# Patient Record
Sex: Male | Born: 1965 | Race: Black or African American | Hispanic: No | Marital: Married | Smoking: Never smoker
Health system: Southern US, Community
[De-identification: ages and names within clinical notes are randomized; demographics above are authoritative.]

## PROBLEM LIST (undated history)

## (undated) DIAGNOSIS — E785 Hyperlipidemia, unspecified: Secondary | ICD-10-CM

## (undated) DIAGNOSIS — I10 Essential (primary) hypertension: Secondary | ICD-10-CM

## (undated) HISTORY — DX: Hyperlipidemia, unspecified: E78.5

---

## 1999-05-29 ENCOUNTER — Encounter: Admission: RE | Admit: 1999-05-29 | Discharge: 1999-05-29 | Payer: Self-pay | Admitting: Orthopedic Surgery

## 1999-05-29 ENCOUNTER — Encounter: Payer: Self-pay | Admitting: Orthopedic Surgery

## 2010-07-10 ENCOUNTER — Emergency Department (HOSPITAL_BASED_OUTPATIENT_CLINIC_OR_DEPARTMENT_OTHER)
Admission: EM | Admit: 2010-07-10 | Discharge: 2010-07-10 | Payer: Self-pay | Source: Home / Self Care | Admitting: Emergency Medicine

## 2014-08-03 LAB — TSH: TSH: 3.24 u[IU]/mL (ref 0.41–5.90)

## 2014-08-03 LAB — CBC AND DIFFERENTIAL: Hemoglobin: 14.8 g/dL (ref 13.5–17.5)

## 2014-08-09 LAB — PSA: PSA: 1.59

## 2014-11-01 LAB — LIPID PANEL
Cholesterol: 189 mg/dL (ref 0–200)
HDL: 68 mg/dL (ref 35–70)
LDL CALC: 107 mg/dL
Triglycerides: 71 mg/dL (ref 40–160)

## 2014-11-02 LAB — BASIC METABOLIC PANEL
BUN: 17 mg/dL (ref 4–21)
CHLORIDE: 101 mmol/L
CO2: 25 mmol/L
Calcium: 9 mg/dL
Creatinine: 0.9 mg/dL (ref 0.6–1.3)
GLUCOSE: 98 mg/dL
Potassium: 3.9 mmol/L (ref 3.4–5.3)
SODIUM: 143 mmol/L (ref 137–147)

## 2016-01-20 ENCOUNTER — Encounter (HOSPITAL_BASED_OUTPATIENT_CLINIC_OR_DEPARTMENT_OTHER): Payer: Self-pay | Admitting: Emergency Medicine

## 2016-01-20 ENCOUNTER — Emergency Department (HOSPITAL_BASED_OUTPATIENT_CLINIC_OR_DEPARTMENT_OTHER): Payer: Self-pay

## 2016-01-20 DIAGNOSIS — Z79899 Other long term (current) drug therapy: Secondary | ICD-10-CM | POA: Insufficient documentation

## 2016-01-20 DIAGNOSIS — M25562 Pain in left knee: Secondary | ICD-10-CM | POA: Insufficient documentation

## 2016-01-20 DIAGNOSIS — I10 Essential (primary) hypertension: Secondary | ICD-10-CM | POA: Insufficient documentation

## 2016-01-20 NOTE — ED Notes (Signed)
Patient reports intermittent left knee pain x 2 -3 weeks. Denies injury

## 2016-01-21 ENCOUNTER — Emergency Department (HOSPITAL_BASED_OUTPATIENT_CLINIC_OR_DEPARTMENT_OTHER)
Admission: EM | Admit: 2016-01-21 | Discharge: 2016-01-21 | Disposition: A | Discharge status: Home / Self Care | Payer: Self-pay | Attending: Emergency Medicine | Admitting: Emergency Medicine

## 2016-01-21 ENCOUNTER — Encounter (HOSPITAL_BASED_OUTPATIENT_CLINIC_OR_DEPARTMENT_OTHER): Payer: Self-pay | Admitting: Emergency Medicine

## 2016-01-21 DIAGNOSIS — M25562 Pain in left knee: Secondary | ICD-10-CM

## 2016-01-21 HISTORY — DX: Essential (primary) hypertension: I10

## 2016-01-21 MED ORDER — PIROXICAM 10 MG PO CAPS: 10.0000 mg | ORAL_CAPSULE | Freq: Every day | ORAL | Status: DC

## 2016-01-21 MED ORDER — KETOROLAC TROMETHAMINE 60 MG/2ML IM SOLN
60.0000 mg | Freq: Once | INTRAMUSCULAR | Status: AC
  Administered 2016-01-21: 60 mg via INTRAMUSCULAR
  Filled 2016-01-21: qty 2

## 2016-01-21 NOTE — ED Provider Notes (Signed)
CSN: [id number]     Arrival date & time 01/20/16  2219 History   First MD Initiated Contact with Patient 01/21/16 0131     Chief Complaint  Patient presents with  . Knee Pain     (Consider location/radiation/quality/duration/timing/severity/associated sxs/prior Treatment) Patient is a 50 y.o. male presenting with knee pain. The history is provided by the patient. No language interpreter was used.  Knee Pain Location:  Knee Time since incident:  4 weeks Knee location:  L knee Pain details:    Quality:  Aching   Radiates to:  Does not radiate   Severity:  Moderate   Onset quality:  Sudden   Timing:  Constant   Progression:  Unchanged Chronicity:  New Dislocation: no   Foreign body present:  No foreign bodies Prior injury to area:  No Relieved by:  Nothing Worsened by:  Nothing tried Ineffective treatments:  None tried Associated symptoms: no itching, no stiffness and no swelling   Risk factors: no concern for non-accidental trauma     Past Medical History  Diagnosis Date  . Hypertension    History reviewed. No pertinent past surgical history. History reviewed. No pertinent family history. Social History  Substance Use Topics  . Smoking status: Never Smoker   . Smokeless tobacco: None  . Alcohol Use: Yes     Comment: occ    Review of Systems  Musculoskeletal: Negative for stiffness.  Skin: Negative for itching.  All other systems reviewed and are negative.     Allergies  Review of patient's allergies indicates no known allergies.  Home Medications   Prior to Admission medications   Medication Sig Start Date End Date Taking? Authorizing Provider  hydrochlorothiazide (MICROZIDE) 12.5 MG capsule Take 12.5 mg by mouth daily.   Yes Historical Provider, MD  lisinopril (PRINIVIL,ZESTRIL) 10 MG tablet Take 10 mg by mouth daily.   Yes Historical Provider, MD   BP 162/103 mmHg  Pulse 66  Temp(Src) 98 F (36.7 C) (Oral)  Resp 18  Ht 5\' 6"  (1.676 m)  Wt 180 lb  (81.647 kg)  BMI 29.07 kg/m2  SpO2 100% Physical Exam  Constitutional: He is oriented to person, place, and time. He appears well-developed and well-nourished. No distress.  HENT:  Head: Normocephalic and atraumatic.  Mouth/Throat: Oropharynx is clear and moist.  Eyes: Conjunctivae are normal. Pupils are equal, round, and reactive to light.  Neck: Normal range of motion. Neck supple.  Cardiovascular: Normal rate, regular rhythm and intact distal pulses.   Pulmonary/Chest: Effort normal and breath sounds normal. No respiratory distress. He has no wheezes. He has no rales.  Abdominal: Soft. Bowel sounds are normal. There is no tenderness. There is no rebound and no guarding.  Musculoskeletal: Normal range of motion.  Neurological: He is alert and oriented to person, place, and time.  Skin: Skin is warm and dry.  Psychiatric: He has a normal mood and affect.    ED Course  Procedures (including critical care time) Labs Review Labs Reviewed - No data to display  Imaging Review Dg Knee Complete 4 Views Left  01/20/2016  CLINICAL DATA:  50 year old male with intermittent left knee pain. EXAM: LEFT KNEE - COMPLETE 4+ VIEW COMPARISON:  None. FINDINGS: There is no acute fracture or dislocation. The bones are well mineralized. There is a small suprapatellar effusion. The soft tissues are otherwise unremarkable. No radiopaque foreign object. IMPRESSION: No acute fracture or dislocation Electronically Signed   By: Elgie CollardArash  Radparvar M.D.   On: 01/20/2016  22:55   I have personally reviewed and evaluated these images and lab results as part of my medical decision-making.   EKG Interpretation None      MDM   Final diagnoses:  None    Filed Vitals:   01/21/16 0129 01/21/16 0254  BP: 162/103 154/102  Pulse: 66 64  Temp: 98 F (36.7 C) 97.6 F (36.4 C)  Resp: 18 18   No results found for this or any previous visit. Dg Knee Complete 4 Views Left  01/20/2016  CLINICAL DATA:  50 year old  male with intermittent left knee pain. EXAM: LEFT KNEE - COMPLETE 4+ VIEW COMPARISON:  None. FINDINGS: There is no acute fracture or dislocation. The bones are well mineralized. There is a small suprapatellar effusion. The soft tissues are otherwise unremarkable. No radiopaque foreign object. IMPRESSION: No acute fracture or dislocation Electronically Signed   By: Elgie CollardArash  Radparvar M.D.   On: 01/20/2016 22:55    Medications  ketorolac (TORADOL) injection 60 mg (60 mg Intramuscular Given 01/21/16 0250)    Will send home knee sleeve and NSAIDs.  PRN follow up with orthopedics    Janeice Stegall, MD 01/21/16 [id number]

## 2016-01-21 NOTE — Discharge Instructions (Signed)

## 2016-01-21 NOTE — ED Notes (Signed)
Pt states he has had left knee swelling for approx 2-3 weeks. Redness noted. No hx of gout or injury.

## 2016-01-21 NOTE — ED Notes (Signed)
CMS intact before and after. Pt tolerated well. No questions.

## 2016-01-21 NOTE — ED Notes (Signed)
Pt given d/c instructions as per chart. Rx x 1. Verbalizes understanding. No questions. 

## 2016-03-01 ENCOUNTER — Encounter (HOSPITAL_BASED_OUTPATIENT_CLINIC_OR_DEPARTMENT_OTHER): Payer: Self-pay | Admitting: *Deleted

## 2016-03-01 ENCOUNTER — Emergency Department (HOSPITAL_BASED_OUTPATIENT_CLINIC_OR_DEPARTMENT_OTHER)
Admission: EM | Admit: 2016-03-01 | Discharge: 2016-03-01 | Disposition: A | Discharge status: Home / Self Care | Payer: Self-pay | Attending: Emergency Medicine | Admitting: Emergency Medicine

## 2016-03-01 DIAGNOSIS — Z79899 Other long term (current) drug therapy: Secondary | ICD-10-CM | POA: Insufficient documentation

## 2016-03-01 DIAGNOSIS — M25562 Pain in left knee: Secondary | ICD-10-CM | POA: Insufficient documentation

## 2016-03-01 DIAGNOSIS — I10 Essential (primary) hypertension: Secondary | ICD-10-CM | POA: Insufficient documentation

## 2016-03-01 LAB — SYNOVIAL CELL COUNT + DIFF, W/ CRYSTALS
Crystals, Fluid: NONE SEEN
EOSINOPHILS-SYNOVIAL: 1 % (ref 0–1)
Lymphocytes-Synovial Fld: 31 % — ABNORMAL HIGH (ref 0–20)
Monocyte-Macrophage-Synovial Fluid: 24 % — ABNORMAL LOW (ref 50–90)
NEUTROPHIL, SYNOVIAL: 44 % — AB (ref 0–25)
WBC, SYNOVIAL: 89 /mm3 (ref 0–200)

## 2016-03-01 LAB — PATHOLOGIST SMEAR REVIEW

## 2016-03-01 MED ORDER — TRIAMCINOLONE ACETONIDE 40 MG/ML IJ SUSP
40.0000 mg | Freq: Once | INTRAMUSCULAR | Status: AC
  Administered 2016-03-01: 40 mg via INTRA_ARTICULAR
  Filled 2016-03-01: qty 5

## 2016-03-01 MED ORDER — BUPIVACAINE HCL (PF) 0.5 % IJ SOLN
10.0000 mL | Freq: Once | INTRAMUSCULAR | Status: AC
  Administered 2016-03-01: 10 mL
  Filled 2016-03-01: qty 10

## 2016-03-01 MED ORDER — PREDNISONE 10 MG PO TABS: 20.0000 mg | ORAL_TABLET | Freq: Two times a day (BID) | ORAL | 0 refills | Status: DC

## 2016-03-01 MED ORDER — HYDROCODONE-ACETAMINOPHEN 5-325 MG PO TABS: 1.0000 | ORAL_TABLET | Freq: Four times a day (QID) | ORAL | 0 refills | Status: DC | PRN

## 2016-03-01 NOTE — ED Provider Notes (Addendum)
[facility]   CSN: [id number] Arrival date & time: 03/01/16  4081  First Provider Contact:  First MD Initiated Contact with Patient 03/01/16 [phone]        History   Chief Complaint Chief Complaint  Patient presents with  . Knee Pain    HPI Billy Baker is a 50 y.o. male.  Patient is a 50 year old male with no significant past medical history. He presents for evaluation of knee pain. This is been ongoing for several months. He was seen here several weeks ago, however is not improved with anti-inflammatories and compressive dressings. His knee remained swollen and painful. He denies any fevers or chills. He reports pain in his knee on the bedpost several months ago, but otherwise denies any injury or trauma.   The history is provided by the patient.  Knee Pain   This is a new problem. Episode onset: 3 months ago. The pain is present in the right knee. The pain is moderate. He has tried nothing for the symptoms. The treatment provided no relief.    Past Medical History:  Diagnosis Date  . Hypertension     There are no active problems to display for this patient.   History reviewed. No pertinent surgical history.     Home Medications    Prior to Admission medications   Medication Sig Start Date End Date Taking? Authorizing Provider  hydrochlorothiazide (MICROZIDE) 12.5 MG capsule Take 12.5 mg by mouth daily.    Historical Provider, MD  lisinopril (PRINIVIL,ZESTRIL) 10 MG tablet Take 10 mg by mouth daily.    Historical Provider, MD  piroxicam (FELDENE) 10 MG capsule Take 1 capsule (10 mg total) by mouth daily. 01/21/16   Cy Blamer, MD    Family History History reviewed. No pertinent family history.  Social History Social History  Substance Use Topics  . Smoking status: Never Smoker  . Smokeless tobacco: Never Used  . Alcohol use Yes     Comment: occ     Allergies   Review of patient's allergies indicates no known  allergies.   Review of Systems Review of Systems  All other systems reviewed and are negative.    Physical Exam Updated Vital Signs BP 126/90 (BP Location: Right Arm)   Pulse 75   Temp 98.1 F (36.7 C) (Oral)   Resp 18   Ht 5\' 6"  (1.676 m)   Wt 180 lb (81.6 kg)   SpO2 97%   BMI 29.05 kg/m   Physical Exam  Constitutional: He is oriented to person, place, and time. He appears well-developed and well-nourished.  HENT:  Head: Normocephalic and atraumatic.  Neck: Normal range of motion. Neck supple.  Musculoskeletal:  The left knee has what appears to be a small to medium sized effusion. He has pain with range of motion. There is no crepitus. Anterior and posterior drawer tests are negative. There is no laxity with varus or valgus stress.  Neurological: He is alert and oriented to person, place, and time.  Skin: Skin is warm and dry.  Nursing note and vitals reviewed.    ED Treatments / Results  Labs (all labs ordered are listed, but only abnormal results are displayed) Labs Reviewed - No data to display  EKG  EKG Interpretation None       Radiology No results found.  Procedures Procedures (including critical care time)  Medications Ordered in ED Medications  bupivacaine (MARCAINE) 0.5 % injection 10 mL (10 mLs Infiltration Given 03/01/16 0642)  triamcinolone acetonide (KENALOG-40) injection 40 mg (40 mg Intra-articular Given 03/01/16 2130)     Initial Impression / Assessment and Plan / ED Course  I have reviewed the triage vital signs and the nursing notes.  Pertinent labs & imaging results that were available during my care of the patient were reviewed by me and considered in my medical decision making (see chart for details).  Clinical Course    ARTHOCENTESIS Performed by: Geoffery Lyons Consent: Verbal consent obtained. Risks and benefits: risks, benefits and alternatives were discussed Consent given by: patient Required items: required blood  products, implants, devices, and special equipment available Patient identity confirmed: verbally with patient Time out: Immediately prior to procedure a "time out" was called to verify the correct patient, procedure, equipment, support staff and site/side marked as required. Indications: Left knee pain and swelling  Joint: Left knee Local anesthesia used: Marcaine  Preparation: Patient was prepped and draped in the usual sterile fashion. Aspirate appearance: Clear yellow  Aspirate amount: 3 ml Patient tolerance: Patient tolerated the procedure well with no immediate complications.     Final Clinical Impressions(s) / ED Diagnoses   Final diagnoses:  None   My initial plan was to treat with steroids and follow up with Orthopedics, however the patient states that he has no insurance and "something has to be done". Arthrocentesis was performed yielding only several cc of clear yellow fluid. He was given 40 mg of Kenalog along with 3 mL of Marcaine intra-articularly.  He will be discharged with pain medication, prednisone, and follow-up with an orthopedic doctor.  New Prescriptions New Prescriptions   No medications on file     Geoffery Lyons, MD 03/01/16 8657    Geoffery Lyons, MD 03/01/16 [phone]

## 2016-03-01 NOTE — Discharge Instructions (Signed)
Prednisone as prescribed.  Hydrocodone as prescribed as needed for pain.  Follow-up with an orthopedist if not improving in the next week. The contact information for Dr. Veda Canning has been provided for you call and make these arrangements.

## 2016-03-01 NOTE — ED Notes (Signed)
Dr. Judd Lien in to see pt, at [facility].

## 2016-03-01 NOTE — ED Triage Notes (Addendum)
Returns for similar L knee pain, pain returning, worsening, not getting better with feldene or ACE wrap. Pinpoints to medial knee. Aggravated by work/ walking on concrete floors. (denies: recent injury, knee surgeries, gout, fever, hot to touch, numbness/ tingling or other sx), has not f/u with specialist d/t no insurance. No PCP. States, "has new job and has to wait for insurance to begin". Ambulatory with steady gait, with limp. Alert, NAD, calm, interactive. Wife at [facility].

## 2016-04-16 ENCOUNTER — Encounter: Payer: Self-pay | Admitting: Family Medicine

## 2016-04-16 ENCOUNTER — Ambulatory Visit (INDEPENDENT_AMBULATORY_CARE_PROVIDER_SITE_OTHER): Payer: Managed Care, Other (non HMO) | Admitting: Family Medicine

## 2016-04-16 DIAGNOSIS — E785 Hyperlipidemia, unspecified: Secondary | ICD-10-CM | POA: Diagnosis not present

## 2016-04-16 DIAGNOSIS — I10 Essential (primary) hypertension: Secondary | ICD-10-CM

## 2016-04-16 DIAGNOSIS — M25562 Pain in left knee: Secondary | ICD-10-CM | POA: Diagnosis not present

## 2016-04-16 LAB — COMPREHENSIVE METABOLIC PANEL
ALT: 13 U/L (ref 9–46)
AST: 14 U/L (ref 10–35)
Albumin: 4.2 g/dL (ref 3.6–5.1)
Alkaline Phosphatase: 55 U/L (ref 40–115)
BUN: 12 mg/dL (ref 7–25)
CHLORIDE: 102 mmol/L (ref 98–110)
CO2: 27 mmol/L (ref 20–31)
CREATININE: 1.21 mg/dL (ref 0.70–1.33)
Calcium: 9.3 mg/dL (ref 8.6–10.3)
GLUCOSE: 112 mg/dL — AB (ref 65–99)
Potassium: 3.7 mmol/L (ref 3.5–5.3)
SODIUM: 140 mmol/L (ref 135–146)
TOTAL PROTEIN: 6.6 g/dL (ref 6.1–8.1)
Total Bilirubin: 0.7 mg/dL (ref 0.2–1.2)

## 2016-04-16 LAB — CBC
HCT: 41 % (ref 38.5–50.0)
Hemoglobin: 14.3 g/dL (ref 13.2–17.1)
MCH: 28.5 pg (ref 27.0–33.0)
MCHC: 34.9 g/dL (ref 32.0–36.0)
MCV: 81.8 fL (ref 80.0–100.0)
MPV: 10.4 fL (ref 7.5–12.5)
PLATELETS: 186 10*3/uL (ref 140–400)
RBC: 5.01 MIL/uL (ref 4.20–5.80)
RDW: 13.8 % (ref 11.0–15.0)
WBC: 9.7 10*3/uL (ref 3.8–10.8)

## 2016-04-16 LAB — TSH: TSH: 1.09 mIU/L (ref 0.40–4.50)

## 2016-04-16 MED ORDER — LISINOPRIL-HYDROCHLOROTHIAZIDE 20-12.5 MG PO TABS: 1.0000 | ORAL_TABLET | Freq: Every day | ORAL | 0 refills | Status: DC

## 2016-04-16 MED ORDER — ATORVASTATIN CALCIUM 20 MG PO TABS: 20.0000 mg | ORAL_TABLET | Freq: Every day | ORAL | 0 refills | Status: DC

## 2016-04-16 MED ORDER — DICLOFENAC SODIUM 1 % TD GEL: 4.0000 g | Freq: Four times a day (QID) | TRANSDERMAL | 11 refills | Status: DC

## 2016-04-16 NOTE — Patient Instructions (Signed)
Thank you for coming in today. 1) Left Knee: Take tylenol daily for pain.  Use voltaren gel 4x daily.  Return in a few weeks if not better.  2) Blood pressure: Stop individual medicines and start the combo pill.  3) Cholesterol: Restart Atorvastatin. Recheck in 3 months.   Get labs today.    Meniscus Tear A meniscus tear is a knee injury in which a piece of the meniscus is torn. The meniscus is a thick, rubbery, wedge-shaped cartilage in the knee. Two menisci are located in each knee. They sit between the upper bone (femur) and lower bone (tibia) that make up the knee joint. Each meniscus acts as a shock absorber for the knee. A torn meniscus is one of the most common types of knee injuries. This injury can range from mild to severe. Surgery may be needed for a severe tear. CAUSES This injury may be caused by any squatting, twisting, or pivoting movement. Sports-related injuries are the most common cause. These often occur from:  Running and stopping suddenly.  Changing direction.  Being tackled or knocked off your feet. As people get older, their meniscus gets thinner and weaker. In these people, tears can happen more easily, such as from climbing stairs.  RISK FACTORS This injury is more likely to happen to:  People who play contact sports.  Males.  People who are 2130-51 years of age. SYMPTOMS  Symptoms of this injury include:  Knee pain, especially at the side of the knee joint. You may feel pain when the injury occurs, or you may only hear a pop and feel pain later.  A feeling that your knee is clicking, catching, locking, or giving way.  Not being able to fully bend or extend your knee.  Bruising or swelling in your knee. DIAGNOSIS  This injury may be diagnosed based on your symptoms and a physical exam. The physical exam may include:  Moving your knee in different ways.  Feeling for tenderness.  Listening for a clicking sound.  Checking if your knee locks or  catches. You may also have tests, such as:  X-rays.  MRI.  A procedure to look inside your knee with a narrow surgical telescope (arthroscopy). You may be referred to a knee specialist (orthopedic surgeon). TREATMENT  Treatment for this injury depends on the severity of the tear. Treatment for a mild tear may include:  Rest.  Medicine to reduce pain and swelling. This is usually a nonsteroidal anti-inflammatory drug (NSAID).  A knee brace or an elastic sleeve or wrap.  Using crutches or a walker to keep weight off your knee and to help you walk.  Exercises to strengthen your knee (physical therapy). You may need surgery if you have a severe tear or if other treatments are not working.  HOME CARE INSTRUCTIONS Managing Pain and Swelling  Take over-the-counter and prescription medicines only as told by your health care provider.  If directed, apply ice to the injured area:  Put ice in a plastic bag.  Place a towel between your skin and the bag.  Leave the ice on for 20 minutes, 2-3 times per day.  Raise (elevate) the injured area above the level of your heart while you are sitting or lying down. Activity  Do not use the injured limb to support your body weight until your health care provider says that you can. Use crutches or a walker as told by your health care provider.  Return to your normal activities as told by  your health care provider. Ask your health care provider what activities are safe for you.  Perform range-of-motion exercises only as told by your health care provider.  Begin doing exercises to strengthen your knee and leg muscles only as told by your health care provider. After you recover, your health care provider may recommend these exercises to help prevent another injury. General Instructions  Use a knee brace or elastic wrap as told by your health care provider.  Keep all follow-up visits as told by your health care provider. This is important. SEEK  MEDICAL CARE IF:  You have a fever.  Your knee becomes red, tender, or swollen.  Your pain medicine is not helping.  Your symptoms get worse or do not improve after 2 weeks of home care.   This information is not intended to replace advice given to you by your health care provider. Make sure you discuss any questions you have with your health care provider.   Document Released: 10/05/2002 Document Revised: 04/05/2015 Document Reviewed: 11/07/2014 Elsevier Interactive Patient Education [employer]

## 2016-04-16 NOTE — Progress Notes (Signed)
Billy Baker is a 50 y.o. male who presents to [name]: Primary Care Sports Medicine today for establish care and discuss left knee pain hypertension and hyperlipidemia.  Left knee pain: Patient notes 6 months of left knee pain. He denies any injury. He notes the pain is felt bilaterally and occasionally associated with locking and catching and popping. He was seen in the emergency department about 6 weeks ago where x-rays were unremarkable. He had aspiration and injection including steroids. The aspirate was negative for crystals. He notes the injection did not help much at all. He notes the injection was performed separately from the aspiration using a blind technique. He notes significant pain especially following a long day standing on his feet.  Hypertension: Patient has long-standing hypertension. He takes hydrochlorothiazide 12.5 mg daily as well as lisinopril 10 mg daily. No chest pains palpitations shortness of breath.  Hyperlipidemia: Patient is not currently taking atorvastatin. This is been prescribed in the past. He denies any muscle aches or pain while on this medication.    Past Medical History:  Diagnosis Date  . Hyperlipidemia   . Hypertension    History reviewed. No pertinent surgical history. Social History  Substance Use Topics  . Smoking status: Never Smoker  . Smokeless tobacco: Never Used  . Alcohol use Yes     Comment: occ   family history includes Cancer in his father; Diabetes in his mother; Heart disease in his maternal grandfather; Hypertension in his father.  ROS as above: No headache, visual changes, nausea, vomiting, diarrhea, constipation, dizziness, abdominal pain, skin rash, fevers, chills, night sweats, weight loss, swollen lymph nodes, body aches, joint swelling, muscle aches, chest pain, shortness of breath, mood changes, visual or auditory hallucinations.      Medications: Current Outpatient Prescriptions  Medication Sig Dispense Refill  . atorvastatin (LIPITOR) 20 MG tablet Take 1 tablet (20 mg total) by mouth daily. 90 tablet 0  . diclofenac sodium (VOLTAREN) 1 % GEL Apply 4 g topically 4 (four) times daily. To affected joint. 100 g 11  . lisinopril-hydrochlorothiazide (ZESTORETIC) 20-12.5 MG tablet Take 1 tablet by mouth daily. 90 tablet 0   No current facility-administered medications for this visit.    No Known Allergies   Exam:  BP (!) 141/93   Pulse 97   Ht 5\' 6"  (1.676 m)   Wt 188 lb (85.3 kg)   BMI 30.34 kg/m  Gen: Well NAD HEENT: EOMI,  MMM Lungs: Normal work of breathing. CTABL Heart: RRR no MRG Abd: NABS, Soft. Nondistended, Nontender Exts: Brisk capillary refill, warm and well perfused.  Left knee: Mild effusion no skin changes. Range of motion 0-100 with mild 1+ retropatellar crepitations. Nontender. Stable ligamentous exam. Positive medial McMurray's test.  Show images for DG Knee Complete 4 Views Left  Study Result   CLINICAL DATA:  50 year old male with intermittent left knee pain.  EXAM: LEFT KNEE - COMPLETE 4+ VIEW  COMPARISON:  None.  FINDINGS: There is no acute fracture or dislocation. The bones are well mineralized. There is a small suprapatellar effusion. The soft tissues are otherwise unremarkable. No radiopaque foreign object.  IMPRESSION: No acute fracture or dislocation   Electronically Signed   By: Elgie Collard M.D.   On: 01/20/2016 22:55     Procedure: Real-time Ultrasound Guided Injection of left knee   Device: GE Logiq E  Images permanently stored and available for review in the ultrasound unit. Verbal informed consent obtained.  Discussed risks and benefits of procedure. Warned about infection bleeding damage to structures skin hypopigmentation and fat atrophy among others. Patient expresses understanding and agreement Time-out conducted.  Noted no overlying  erythema, induration, or other signs of local infection.  Skin prepped in a sterile fashion.  Local anesthesia: Topical Ethyl chloride.  With sterile technique and under real time ultrasound guidance: 80mg  Kenalog and 4 mL of Marcaine injected easily.  Completed without difficulty  Pain immediately resolved suggesting accurate placement of the medication.  Advised to call if fevers/chills, erythema, induration, drainage, or persistent bleeding.  Images permanently stored and available for review in the ultrasound unit.  Impression: Technically successful ultrasound guided injection.    No results found for this or any previous visit (from the past 24 hour(s)). No results found.    Assessment and Plan: 50 y.o. male with   Left knee pain: Concerning for meniscus injury. Injection performed today. MRI pending. Return following MRI.  Hypertension: Increase to lisinopril 20 hydrochlorothiazide 12.5. Check CMP TSH vitamin D.  Hyperlipidemia: Restart Lipitor. Check labs in 3 months   Orders Placed This Encounter  Procedures  . MR Knee Left  Wo Contrast    Standing Status:   Future    Standing Expiration Date:   06/16/2017    Order Specific Question:   Reason for Exam (SYMPTOM  OR DIAGNOSIS REQUIRED)    Answer:   eval pain suspect meniscus injury    Order Specific Question:   [facility]    [facility]    Order Specific Question:   What is the patient's sedation requirement?    Answer:   No Sedation    Order Specific Question:   Does the patient have a pacemaker or implanted devices?    Answer:   No  . CBC  . Comprehensive metabolic panel    Order Specific Question:   Has the patient fasted?    Answer:   No  . Hemoglobin A1c  . TSH  . VITAMIN D 25 Hydroxy (Vit-D Deficiency, Fractures)    Discussed warning signs or symptoms. Please see discharge instructions. Patient expresses understanding.

## 2016-04-17 ENCOUNTER — Encounter: Payer: Self-pay | Admitting: Family Medicine

## 2016-04-17 DIAGNOSIS — R7303 Prediabetes: Secondary | ICD-10-CM | POA: Insufficient documentation

## 2016-04-17 DIAGNOSIS — E559 Vitamin D deficiency, unspecified: Secondary | ICD-10-CM | POA: Insufficient documentation

## 2016-04-17 LAB — HEMOGLOBIN A1C
HEMOGLOBIN A1C: 5.8 % — AB (ref <5.7)
Mean Plasma Glucose: 120 mg/dL

## 2016-04-17 LAB — VITAMIN D 25 HYDROXY (VIT D DEFICIENCY, FRACTURES): Vit D, 25-Hydroxy: 21 ng/mL — ABNORMAL LOW (ref 30–100)

## 2016-04-24 ENCOUNTER — Encounter: Payer: Self-pay | Admitting: Family Medicine

## 2016-04-26 ENCOUNTER — Telehealth: Payer: Self-pay | Admitting: Family Medicine

## 2016-04-26 DIAGNOSIS — S83209A Unspecified tear of unspecified meniscus, current injury, unspecified knee, initial encounter: Secondary | ICD-10-CM | POA: Insufficient documentation

## 2016-04-26 DIAGNOSIS — S83207A Unspecified tear of unspecified meniscus, current injury, left knee, initial encounter: Secondary | ICD-10-CM

## 2016-04-26 NOTE — Telephone Encounter (Signed)
MRI knee shows meniscus tear.  Return to clinic with image CD to go over results in detail.

## 2016-04-29 ENCOUNTER — Encounter: Payer: Self-pay | Admitting: Family Medicine

## 2016-04-29 NOTE — Telephone Encounter (Signed)
Pt notified. appt scheduled.

## 2016-05-02 ENCOUNTER — Ambulatory Visit: Payer: Managed Care, Other (non HMO) | Admitting: Family Medicine

## 2016-05-14 ENCOUNTER — Ambulatory Visit (INDEPENDENT_AMBULATORY_CARE_PROVIDER_SITE_OTHER): Payer: Managed Care, Other (non HMO) | Admitting: Family Medicine

## 2016-05-14 ENCOUNTER — Encounter: Payer: Self-pay | Admitting: Family Medicine

## 2016-05-14 VITALS — BP 133/80 | HR 70 | Wt 183.0 lb

## 2016-05-14 DIAGNOSIS — S83242A Other tear of medial meniscus, current injury, left knee, initial encounter: Secondary | ICD-10-CM

## 2016-05-14 NOTE — Progress Notes (Signed)
                                                                      Billy Baker is a 50 y.o. male who [facility] today for follow-up knee pain. Patient was seen over the past several weeks. He's been complaining of pain now for a few months and was given a steroid injection and help for a few weeks. Additionally an MRI that showed a posterior horn medial meniscus tear. Today for follow-up. He notes popping clicking and slight joint instability with activity. Symptoms are bothersome and interfering with quality of life.   Past Medical History:  Diagnosis Date  . Hyperlipidemia   . Hypertension    No past surgical history on file. Social History  Substance Use Topics  . Smoking status: Never Smoker  . Smokeless tobacco: Never Used  . Alcohol use Yes     Comment: occ   family history includes Cancer in his father; Diabetes in his mother; Heart disease in his maternal grandfather; Hypertension in his father.  ROS as above:  Medications: Current Outpatient Prescriptions  Medication Sig Dispense Refill  . atorvastatin (LIPITOR) 20 MG tablet Take 1 tablet (20 mg total) by mouth daily. 90 tablet 0  . diclofenac sodium (VOLTAREN) 1 % GEL Apply 4 g topically 4 (four) times daily. To affected joint. 100 g 11  . lisinopril-hydrochlorothiazide (ZESTORETIC) 20-12.5 MG tablet Take 1 tablet by mouth daily. 90 tablet 0   No current facility-administered medications for this visit.    No Known Allergies  Health Maintenance Health Maintenance  Topic Date Due  . HIV Screening  12/22/1979  . INFLUENZA VACCINE  04/16/2017 (Originally 02/27/2016)  . COLONOSCOPY  07/30/2023  . TETANUS/TDAP  08/09/2024     Exam:  BP 133/80   Pulse 70   Wt 183 lb (83 kg)   BMI 29.54 kg/m  Gen: Well NAD Left knee: Mild effusion no skin changes. Range of motion 0-100 with mild 1+ retropatellar crepitations. Nontender. Stable ligamentous  exam. Positive medial McMurray's test.  Scanned MRI documented showing posterior horn medial meniscus tear reviewed   No results found for this or any previous visit (from the past 72 hour(s)). No results found.    Assessment and Plan: 50 y.o. male with medial meniscus tear with mechanical symptoms failed conservative management. Refer to orthopedic surgery for evaluation and possible surgical treatment.   Orders Placed This Encounter  Procedures  . Ambulatory referral to Orthopedic Surgery    Referral Priority:   Routine    Referral Type:   Surgical    Referral Reason:   Specialty Services Required    Requested Specialty:   Orthopedic Surgery    Number of Visits Requested:   1    Discussed warning signs or symptoms. Please see discharge instructions. Patient expresses understanding.

## 2016-05-14 NOTE — Patient Instructions (Signed)
Thank you for coming in today. Follow up with Dr Magnus IvanBlackman.     Meniscus Tear A meniscus tear is a knee injury in which a piece of the meniscus is torn. The meniscus is a thick, rubbery, wedge-shaped cartilage in the knee. Two menisci are located in each knee. They sit between the upper bone (femur) and lower bone (tibia) that make up the knee joint. Each meniscus acts as a shock absorber for the knee. A torn meniscus is one of the most common types of knee injuries. This injury can range from mild to severe. Surgery may be needed for a severe tear. CAUSES This injury may be caused by any squatting, twisting, or pivoting movement. Sports-related injuries are the most common cause. These often occur from:  Running and stopping suddenly.  Changing direction.  Being tackled or knocked off your feet. As people get older, their meniscus gets thinner and weaker. In these people, tears can happen more easily, such as from climbing stairs.  RISK FACTORS This injury is more likely to happen to:  People who play contact sports.  Males.  People who are 51-640 years of age. SYMPTOMS  Symptoms of this injury include:  Knee pain, especially at the side of the knee joint. You may feel pain when the injury occurs, or you may only hear a pop and feel pain later.  A feeling that your knee is clicking, catching, locking, or giving way.  Not being able to fully bend or extend your knee.  Bruising or swelling in your knee. DIAGNOSIS  This injury may be diagnosed based on your symptoms and a physical exam. The physical exam may include:  Moving your knee in different ways.  Feeling for tenderness.  Listening for a clicking sound.  Checking if your knee locks or catches. You may also have tests, such as:  X-rays.  MRI.  A procedure to look inside your knee with a narrow surgical telescope (arthroscopy). You may be referred to a knee specialist (orthopedic surgeon). TREATMENT  Treatment  for this injury depends on the severity of the tear. Treatment for a mild tear may include:  Rest.  Medicine to reduce pain and swelling. This is usually a nonsteroidal anti-inflammatory drug (NSAID).  A knee brace or an elastic sleeve or wrap.  Using crutches or a walker to keep weight off your knee and to help you walk.  Exercises to strengthen your knee (physical therapy). You may need surgery if you have a severe tear or if other treatments are not working.  HOME CARE INSTRUCTIONS Managing Pain and Swelling  Take over-the-counter and prescription medicines only as told by your health care provider.  If directed, apply ice to the injured area:  Put ice in a plastic bag.  Place a towel between your skin and the bag.  Leave the ice on for 20 minutes, 2-3 times per day.  Raise (elevate) the injured area above the level of your heart while you are sitting or lying down. Activity  Do not use the injured limb to support your body weight until your health care provider says that you can. Use crutches or a walker as told by your health care provider.  Return to your normal activities as told by your health care provider. Ask your health care provider what activities are safe for you.  Perform range-of-motion exercises only as told by your health care provider.  Begin doing exercises to strengthen your knee and leg muscles only as told by your  health care provider. After you recover, your health care provider may recommend these exercises to help prevent another injury. General Instructions  Use a knee brace or elastic wrap as told by your health care provider.  Keep all follow-up visits as told by your health care provider. This is important. SEEK MEDICAL CARE IF:  You have a fever.  Your knee becomes red, tender, or swollen.  Your pain medicine is not helping.  Your symptoms get worse or do not improve after 2 weeks of home care.   This information is not intended to  replace advice given to you by your health care provider. Make sure you discuss any questions you have with your health care provider.   Document Released: 10/05/2002 Document Revised: 04/05/2015 Document Reviewed: 11/07/2014 Elsevier Interactive Patient Education [employer]

## 2016-05-27 ENCOUNTER — Telehealth: Payer: Self-pay | Admitting: Family Medicine

## 2016-05-27 ENCOUNTER — Ambulatory Visit (INDEPENDENT_AMBULATORY_CARE_PROVIDER_SITE_OTHER): Payer: Self-pay | Admitting: Orthopaedic Surgery

## 2016-05-27 DIAGNOSIS — S83242A Other tear of medial meniscus, current injury, left knee, initial encounter: Secondary | ICD-10-CM

## 2016-05-27 NOTE — Telephone Encounter (Signed)
Referral placed again.

## 2016-05-27 NOTE — Telephone Encounter (Signed)
Billy Baker wasn't able to go to the scheduled appointment today on 05/27/16 b/c Monday is not the best available day but he is wanting to get another Ortho Surgeon as soon as possible

## 2016-05-28 ENCOUNTER — Other Ambulatory Visit: Payer: Self-pay | Admitting: Orthopedic Surgery

## 2016-06-05 ENCOUNTER — Encounter (HOSPITAL_BASED_OUTPATIENT_CLINIC_OR_DEPARTMENT_OTHER): Payer: Self-pay | Admitting: *Deleted

## 2016-06-10 ENCOUNTER — Encounter (HOSPITAL_BASED_OUTPATIENT_CLINIC_OR_DEPARTMENT_OTHER)
Admission: RE | Admit: 2016-06-10 | Discharge: 2016-06-10 | Disposition: A | Discharge status: Home / Self Care | Payer: Managed Care, Other (non HMO) | Source: Ambulatory Visit | Attending: Orthopedic Surgery | Admitting: Orthopedic Surgery

## 2016-06-10 DIAGNOSIS — E785 Hyperlipidemia, unspecified: Secondary | ICD-10-CM | POA: Diagnosis not present

## 2016-06-10 DIAGNOSIS — I10 Essential (primary) hypertension: Secondary | ICD-10-CM | POA: Diagnosis not present

## 2016-06-10 DIAGNOSIS — M23204 Derangement of unspecified medial meniscus due to old tear or injury, left knee: Secondary | ICD-10-CM | POA: Diagnosis present

## 2016-06-10 DIAGNOSIS — Z833 Family history of diabetes mellitus: Secondary | ICD-10-CM | POA: Diagnosis not present

## 2016-06-10 DIAGNOSIS — M23222 Derangement of posterior horn of medial meniscus due to old tear or injury, left knee: Secondary | ICD-10-CM | POA: Diagnosis not present

## 2016-06-10 DIAGNOSIS — M2342 Loose body in knee, left knee: Secondary | ICD-10-CM | POA: Diagnosis not present

## 2016-06-10 DIAGNOSIS — M94262 Chondromalacia, left knee: Secondary | ICD-10-CM | POA: Diagnosis not present

## 2016-06-10 DIAGNOSIS — Z8249 Family history of ischemic heart disease and other diseases of the circulatory system: Secondary | ICD-10-CM | POA: Diagnosis not present

## 2016-06-10 DIAGNOSIS — Z809 Family history of malignant neoplasm, unspecified: Secondary | ICD-10-CM | POA: Diagnosis not present

## 2016-06-10 DIAGNOSIS — M25462 Effusion, left knee: Secondary | ICD-10-CM | POA: Diagnosis not present

## 2016-06-10 LAB — BASIC METABOLIC PANEL
ANION GAP: 7 (ref 5–15)
BUN: 12 mg/dL (ref 6–20)
CO2: 29 mmol/L (ref 22–32)
Calcium: 9.2 mg/dL (ref 8.9–10.3)
Chloride: 104 mmol/L (ref 101–111)
Creatinine, Ser: 1.02 mg/dL (ref 0.61–1.24)
GFR calc Af Amer: >60 mL/min (ref >60)
GLUCOSE: 98 mg/dL (ref 65–99)
POTASSIUM: 4 mmol/L (ref 3.5–5.1)
SODIUM: 140 mmol/L (ref 135–145)

## 2016-06-11 NOTE — H&P (Signed)
Billy Baker is an 50 y.o. male.   Chief Complaint:  Left Knee Pain  HPI: Patient presents with a chief complaint of left knee pain.  Patient states that Billy Baker injured himself approximately 3 or 4 months ago.  Billy Baker believes that Billy Baker injured his knee on a bedpost.  Billy Baker is noticed constant and moderate to severe pain since then.  Billy Baker describes a sharp burning pain.  It does wake him from sleep.  Worse with work better with rest ice and elevation.  Billy Baker has received injections in the past.  Billy Baker denies any fevers chills night sweats or other signs of infection.  Past Medical History:  Diagnosis Date  . Hyperlipidemia   . Hypertension     History reviewed. No pertinent surgical history.  Family History  Problem Relation Age of Onset  . Diabetes Mother   . Cancer Father   . Hypertension Father   . Heart disease Maternal Grandfather    Social History:  reports that Billy Baker has never smoked. Billy Baker has never used smokeless tobacco. Billy Baker reports that Billy Baker drinks alcohol. Billy Baker reports that Billy Baker does not use drugs.  Allergies: No Known Allergies  No prescriptions prior to admission.    Results for orders placed or performed during the hospital encounter of 06/12/16 (from the past 48 hour(s))  Basic metabolic panel     Status: None   Collection Time: 06/10/16  2:57 PM  Result Value Ref Range   Sodium 140 135 - 145 mmol/L   Potassium 4.0 3.5 - 5.1 mmol/L   Chloride 104 101 - 111 mmol/L   CO2 29 22 - 32 mmol/L   Glucose, Bld 98 65 - 99 mg/dL   BUN 12 6 - 20 mg/dL   Creatinine, Ser 1.02 0.61 - 1.24 mg/dL   Calcium 9.2 8.9 - 10.3 mg/dL   GFR calc non Af Amer >60 >60 mL/min   GFR calc Af Amer >60 >60 mL/min    Comment: (NOTE) The eGFR has been calculated using the CKD EPI equation. This calculation has not been validated in all clinical situations. eGFR's persistently <60 mL/min signify possible Chronic Kidney Disease.    Anion gap 7 5 - 15   No results found.  Review of Systems  Constitutional: Negative.    HENT: Positive for tinnitus.   Eyes: Positive for blurred vision.       Poor vision  Respiratory: Negative.   Cardiovascular:       Heart murmur and HTN  Gastrointestinal: Positive for heartburn.  Genitourinary: Positive for frequency.  Musculoskeletal: Positive for joint pain and myalgias.  Skin: Negative.   Neurological: Negative.   Endo/Heme/Allergies: Positive for polydipsia.  Psychiatric/Behavioral: Negative.     Height '5\' 6"'$  (1.676 m), weight 83 kg (183 lb). Physical Exam  Constitutional: Billy Baker is oriented to person, place, and time. Billy Baker appears well-developed and well-nourished.  HENT:  Head: Normocephalic and atraumatic.  Eyes: Pupils are equal, round, and reactive to light.  Neck: Normal range of motion. Neck supple.  Cardiovascular: Intact distal pulses.   Respiratory: Effort normal.  Musculoskeletal: Billy Baker exhibits tenderness.  the patient's right knee has good strength good range of motion and no pain.  Patient's left knee does have a moderate effusion.  No erythema or warmth.  Billy Baker does have pain with extremes of flexion.  McMurray's test does cause obvious pain and popping.  Tenderness over the posterior medial joint line.  No instability with valgus or varus stress.  His calves are soft and  nontender.  Billy Baker is neurovascularly intact distally.  Neurological: Billy Baker is alert and oriented to person, place, and time.  Skin: Skin is warm and dry.  Psychiatric: Billy Baker has a normal mood and affect. His behavior is normal. Judgment and thought content normal.    Patient does have an MRI with him today and shows a large posterior horn medial meniscal tear and effusion.  Assessment/Plan Assess: Posterior horn medial meniscal tear left knee  Plan: Treatment options are discussed with the patient.  This patient is also discussed with Dr. Mayer Camel who also examined his patient.  Today, the patient has agreed and consented to a left knee arthroscopy.  Benefits risks and potential adverse effects of  surgery are discussed.  Billy Baker wishes to proceed and a posting slip is completed.  We and displacing this patient back at the time of surgical intervention.  Billy Baker is to follow-up in approximately 10 days after surgery or sooner if needed.  Call with any issues.  No medications asked for or given today.  Bunny Kleist R, PA-C 06/11/2016, 8:10 AM

## 2016-06-12 ENCOUNTER — Ambulatory Visit (HOSPITAL_BASED_OUTPATIENT_CLINIC_OR_DEPARTMENT_OTHER)
Admission: RE | Admit: 2016-06-12 | Discharge: 2016-06-12 | Disposition: A | Discharge status: Home / Self Care | Payer: Managed Care, Other (non HMO) | Source: Ambulatory Visit | Attending: Orthopedic Surgery | Admitting: Orthopedic Surgery

## 2016-06-12 ENCOUNTER — Ambulatory Visit (HOSPITAL_BASED_OUTPATIENT_CLINIC_OR_DEPARTMENT_OTHER): Payer: Managed Care, Other (non HMO) | Admitting: Certified Registered"

## 2016-06-12 ENCOUNTER — Encounter (HOSPITAL_BASED_OUTPATIENT_CLINIC_OR_DEPARTMENT_OTHER): Payer: Self-pay | Admitting: *Deleted

## 2016-06-12 ENCOUNTER — Encounter (HOSPITAL_BASED_OUTPATIENT_CLINIC_OR_DEPARTMENT_OTHER)
Admission: RE | Disposition: A | Discharge status: Home / Self Care | Payer: Self-pay | Source: Ambulatory Visit | Attending: Orthopedic Surgery

## 2016-06-12 DIAGNOSIS — Z833 Family history of diabetes mellitus: Secondary | ICD-10-CM | POA: Insufficient documentation

## 2016-06-12 DIAGNOSIS — M25462 Effusion, left knee: Secondary | ICD-10-CM | POA: Insufficient documentation

## 2016-06-12 DIAGNOSIS — I10 Essential (primary) hypertension: Secondary | ICD-10-CM | POA: Insufficient documentation

## 2016-06-12 DIAGNOSIS — Z809 Family history of malignant neoplasm, unspecified: Secondary | ICD-10-CM | POA: Insufficient documentation

## 2016-06-12 DIAGNOSIS — M23222 Derangement of posterior horn of medial meniscus due to old tear or injury, left knee: Secondary | ICD-10-CM | POA: Insufficient documentation

## 2016-06-12 DIAGNOSIS — M94262 Chondromalacia, left knee: Secondary | ICD-10-CM | POA: Insufficient documentation

## 2016-06-12 DIAGNOSIS — E785 Hyperlipidemia, unspecified: Secondary | ICD-10-CM | POA: Insufficient documentation

## 2016-06-12 DIAGNOSIS — S83242A Other tear of medial meniscus, current injury, left knee, initial encounter: Secondary | ICD-10-CM

## 2016-06-12 DIAGNOSIS — M2342 Loose body in knee, left knee: Secondary | ICD-10-CM | POA: Insufficient documentation

## 2016-06-12 DIAGNOSIS — Z8249 Family history of ischemic heart disease and other diseases of the circulatory system: Secondary | ICD-10-CM | POA: Insufficient documentation

## 2016-06-12 HISTORY — PX: CHONDROPLASTY: SHX5177

## 2016-06-12 HISTORY — PX: KNEE ARTHROSCOPY WITH MEDIAL MENISECTOMY: SHX5651

## 2016-06-12 SURGERY — ARTHROSCOPY, KNEE, WITH MEDIAL MENISCECTOMY
Anesthesia: General | Site: Knee | Laterality: Left

## 2016-06-12 MED ORDER — CEFAZOLIN SODIUM-DEXTROSE 2-4 GM/100ML-% IV SOLN
INTRAVENOUS | Status: AC
  Filled 2016-06-12: qty 100

## 2016-06-12 MED ORDER — ONDANSETRON HCL 4 MG/2ML IJ SOLN
INTRAMUSCULAR | Status: DC | PRN
  Administered 2016-06-12: 4 mg via INTRAVENOUS

## 2016-06-12 MED ORDER — LIDOCAINE 2% (20 MG/ML) 5 ML SYRINGE
INTRAMUSCULAR | Status: DC | PRN
  Administered 2016-06-12: 60 mg via INTRAVENOUS

## 2016-06-12 MED ORDER — LACTATED RINGERS IV SOLN
INTRAVENOUS | Status: DC
  Administered 2016-06-12 (×2): via INTRAVENOUS

## 2016-06-12 MED ORDER — EPINEPHRINE 30 MG/30ML IJ SOLN
INTRAMUSCULAR | Status: AC
  Filled 2016-06-12: qty 1

## 2016-06-12 MED ORDER — HYDROMORPHONE HCL 1 MG/ML IJ SOLN
0.2500 mg | INTRAMUSCULAR | Status: DC | PRN
  Administered 2016-06-12 (×3): 0.5 mg via INTRAVENOUS

## 2016-06-12 MED ORDER — SODIUM CHLORIDE 0.9 % IR SOLN
Status: DC | PRN
  Administered 2016-06-12: 3000 mL

## 2016-06-12 MED ORDER — HYDROCODONE-ACETAMINOPHEN 5-325 MG PO TABS: 1.0000 | ORAL_TABLET | Freq: Four times a day (QID) | ORAL | 0 refills | Status: DC | PRN

## 2016-06-12 MED ORDER — DEXAMETHASONE SODIUM PHOSPHATE 4 MG/ML IJ SOLN
INTRAMUSCULAR | Status: DC | PRN
  Administered 2016-06-12: 10 mg via INTRAVENOUS

## 2016-06-12 MED ORDER — MIDAZOLAM HCL 2 MG/2ML IJ SOLN
INTRAMUSCULAR | Status: AC
  Filled 2016-06-12: qty 2

## 2016-06-12 MED ORDER — ONDANSETRON HCL 4 MG/2ML IJ SOLN: 4.0000 mg | Freq: Four times a day (QID) | INTRAMUSCULAR | Status: DC | PRN

## 2016-06-12 MED ORDER — CEFAZOLIN SODIUM-DEXTROSE 2-4 GM/100ML-% IV SOLN
2.0000 g | INTRAVENOUS | Status: AC
Start: 2016-06-12 — End: 2016-06-12
  Administered 2016-06-12: 2 g via INTRAVENOUS

## 2016-06-12 MED ORDER — DEXAMETHASONE SODIUM PHOSPHATE 10 MG/ML IJ SOLN
INTRAMUSCULAR | Status: AC
  Filled 2016-06-12: qty 1

## 2016-06-12 MED ORDER — PROPOFOL 10 MG/ML IV BOLUS
INTRAVENOUS | Status: DC | PRN
  Administered 2016-06-12: 150 mg via INTRAVENOUS
  Administered 2016-06-12: 50 mg via INTRAVENOUS

## 2016-06-12 MED ORDER — CHLORHEXIDINE GLUCONATE 4 % EX LIQD: 60.0000 mL | Freq: Once | CUTANEOUS | Status: DC

## 2016-06-12 MED ORDER — BUPIVACAINE HCL (PF) 0.5 % IJ SOLN
INTRAMUSCULAR | Status: DC | PRN
  Administered 2016-06-12: 20 mL

## 2016-06-12 MED ORDER — SCOPOLAMINE 1 MG/3DAYS TD PT72: 1.0000 | MEDICATED_PATCH | Freq: Once | TRANSDERMAL | Status: DC | PRN

## 2016-06-12 MED ORDER — ONDANSETRON HCL 4 MG/2ML IJ SOLN
INTRAMUSCULAR | Status: AC
Start: 2016-06-12 — End: 2016-06-12
  Filled 2016-06-12: qty 2

## 2016-06-12 MED ORDER — FENTANYL CITRATE (PF) 100 MCG/2ML IJ SOLN
50.0000 ug | INTRAMUSCULAR | Status: AC | PRN
  Administered 2016-06-12 (×3): 50 ug via INTRAVENOUS

## 2016-06-12 MED ORDER — FENTANYL CITRATE (PF) 100 MCG/2ML IJ SOLN
INTRAMUSCULAR | Status: AC
  Filled 2016-06-12: qty 2

## 2016-06-12 MED ORDER — LIDOCAINE 2% (20 MG/ML) 5 ML SYRINGE
INTRAMUSCULAR | Status: AC
  Filled 2016-06-12: qty 5

## 2016-06-12 MED ORDER — DEXTROSE-NACL 5-0.45 % IV SOLN: INTRAVENOUS | Status: DC

## 2016-06-12 MED ORDER — HYDROMORPHONE HCL 1 MG/ML IJ SOLN
INTRAMUSCULAR | Status: AC
  Filled 2016-06-12: qty 1

## 2016-06-12 MED ORDER — MIDAZOLAM HCL 2 MG/2ML IJ SOLN
1.0000 mg | INTRAMUSCULAR | Status: DC | PRN
  Administered 2016-06-12: 2 mg via INTRAVENOUS

## 2016-06-12 MED ORDER — OXYCODONE HCL 5 MG PO TABS: 5.0000 mg | ORAL_TABLET | Freq: Once | ORAL | Status: DC | PRN

## 2016-06-12 MED ORDER — OXYCODONE HCL 5 MG/5ML PO SOLN: 5.0000 mg | Freq: Once | ORAL | Status: DC | PRN

## 2016-06-12 SURGICAL SUPPLY — 43 items
BANDAGE ACE 6X5 VEL STRL LF (GAUZE/BANDAGES/DRESSINGS) ×4 IMPLANT
BLADE 4.2CUDA (BLADE) IMPLANT
BLADE CUDA GRT WHITE 3.5 (BLADE) ×3 IMPLANT
BLADE CUTTER GATOR 3.5 (BLADE) IMPLANT
BLADE GREAT WHITE 4.2 (BLADE) ×2 IMPLANT
BLADE GREAT WHITE 4.2MM (BLADE) ×1
BNDG COHESIVE 6X5 TAN STRL LF (GAUZE/BANDAGES/DRESSINGS) ×4 IMPLANT
DRAPE ARTHROSCOPY W/POUCH 114 (DRAPES) ×4 IMPLANT
DURAPREP 26ML APPLICATOR (WOUND CARE) ×4 IMPLANT
ELECT MENISCUS 165MM 90D (ELECTRODE) IMPLANT
ELECT REM PT RETURN 9FT ADLT (ELECTROSURGICAL) ×4
ELECTRODE REM PT RTRN 9FT ADLT (ELECTROSURGICAL) ×1 IMPLANT
GAUZE SPONGE 4X4 12PLY STRL (GAUZE/BANDAGES/DRESSINGS) ×4 IMPLANT
GAUZE XEROFORM 1X8 LF (GAUZE/BANDAGES/DRESSINGS) ×4 IMPLANT
GLOVE BIO SURGEON STRL SZ7.5 (GLOVE) ×4 IMPLANT
GLOVE BIO SURGEON STRL SZ8.5 (GLOVE) ×4 IMPLANT
GLOVE BIOGEL PI IND STRL 7.0 (GLOVE) ×1 IMPLANT
GLOVE BIOGEL PI IND STRL 8 (GLOVE) ×2 IMPLANT
GLOVE BIOGEL PI IND STRL 9 (GLOVE) ×2 IMPLANT
GLOVE BIOGEL PI INDICATOR 7.0 (GLOVE) ×2
GLOVE BIOGEL PI INDICATOR 8 (GLOVE) ×2
GLOVE BIOGEL PI INDICATOR 9 (GLOVE) ×2
GLOVE SURG SS PI 7.0 STRL IVOR (GLOVE) ×3 IMPLANT
GOWN STRL REUS W/ TWL LRG LVL3 (GOWN DISPOSABLE) ×4 IMPLANT
GOWN STRL REUS W/TWL LRG LVL3 (GOWN DISPOSABLE) ×8
GOWN STRL REUS W/TWL XL LVL3 (GOWN DISPOSABLE) ×4 IMPLANT
IV NS IRRIG 3000ML ARTHROMATIC (IV SOLUTION) ×7 IMPLANT
KNEE WRAP E Z 3 GEL PACK (MISCELLANEOUS) ×4 IMPLANT
MANIFOLD NEPTUNE II (INSTRUMENTS) IMPLANT
NDL SAFETY ECLIPSE 18X1.5 (NEEDLE) ×2 IMPLANT
NEEDLE HYPO 18GX1.5 SHARP (NEEDLE) ×4
PACK ARTHROSCOPY DSU (CUSTOM PROCEDURE TRAY) ×4 IMPLANT
PACK BASIN DAY SURGERY FS (CUSTOM PROCEDURE TRAY) ×4 IMPLANT
PAD ALCOHOL SWAB (MISCELLANEOUS) ×4 IMPLANT
PENCIL BUTTON HOLSTER BLD 10FT (ELECTRODE) IMPLANT
PROBE BIPOLAR ATHRO 135MM 90D (MISCELLANEOUS) IMPLANT
SET ARTHROSCOPY TUBING (MISCELLANEOUS) ×4
SET ARTHROSCOPY TUBING LN (MISCELLANEOUS) ×2 IMPLANT
SLEEVE SCD COMPRESS KNEE MED (MISCELLANEOUS) ×3 IMPLANT
SYR 3ML 18GX1 1/2 (SYRINGE) IMPLANT
SYR 5ML LL (SYRINGE) ×4 IMPLANT
TOWEL OR 17X24 6PK STRL BLUE (TOWEL DISPOSABLE) ×4 IMPLANT
WATER STERILE IRR 1000ML POUR (IV SOLUTION) ×4 IMPLANT

## 2016-06-12 NOTE — Anesthesia Postprocedure Evaluation (Signed)
Anesthesia Post Note  Patient: Billy LightKevin Baker  Procedure(s) Performed: Procedure(s) (LRB): KNEE ARTHROSCOPY WITH PARTIAL MEDIAL MENISECTOMY (Left) CHONDROPLASTY (Left)  Patient location during evaluation: PACU Anesthesia Type: General Level of consciousness: awake and alert and patient cooperative Pain management: pain level controlled Vital Signs Assessment: post-procedure vital signs reviewed and stable Respiratory status: spontaneous breathing and respiratory function stable Cardiovascular status: stable Anesthetic complications: no    Last Vitals:  Vitals:   06/12/16 1319 06/12/16 1321  BP: (!) 162/107   Pulse: 68 78  Resp: (!) 22 13  Temp:      Last Pain:  Vitals:   06/12/16 1315  TempSrc:   PainSc: 7                  [name] S

## 2016-06-12 NOTE — Interval H&P Note (Signed)
History and Physical Interval Note:  06/12/2016 11:24 AM  Billy Baker  has presented today for surgery, with the diagnosis of LEFT KNEE MEDIAL MENISCUS TEAR  The various methods of treatment have been discussed with the patient and family. After consideration of risks, benefits and other options for treatment, the patient has consented to  Procedure(s): ARTHROSCOPY KNEE (Left) as a surgical intervention .  The patient's history has been reviewed, patient examined, no change in status, stable for surgery.  I have reviewed the patient's chart and labs.  Questions were answered to the patient's satisfaction.     Nestor LewandowskyOWAN,Estiven Kohan J

## 2016-06-12 NOTE — Discharge Instructions (Signed)

## 2016-06-12 NOTE — Anesthesia Preprocedure Evaluation (Signed)
Anesthesia Evaluation  Patient identified by MRN, date of birth, ID band Patient awake    Reviewed: Allergy & Precautions, H&P , NPO status , Patient's Chart, lab work & pertinent test results  Airway Mallampati: II   Neck ROM: full    Dental   Pulmonary neg pulmonary ROS,    breath sounds clear to auscultation       Cardiovascular hypertension,  Rhythm:regular Rate:Normal     Neuro/Psych    GI/Hepatic   Endo/Other    Renal/GU      Musculoskeletal   Abdominal   Peds  Hematology   Anesthesia Other Findings   Reproductive/Obstetrics                             Anesthesia Physical Anesthesia Plan  ASA: II  Anesthesia Plan: General   Post-op Pain Management:    Induction: Intravenous  Airway Management Planned: LMA  Additional Equipment:   Intra-op Plan:   Post-operative Plan:   Informed Consent: I have reviewed the patients History and Physical, chart, labs and discussed the procedure including the risks, benefits and alternatives for the proposed anesthesia with the patient or authorized representative who has indicated his/her understanding and acceptance.     Plan Discussed with: CRNA, Anesthesiologist and Surgeon  Anesthesia Plan Comments:         Anesthesia Quick Evaluation  

## 2016-06-12 NOTE — Transfer of Care (Signed)
Immediate Anesthesia Transfer of Care Note  Patient: Billy LightKevin Woessner  Procedure(s) Performed: Procedure(s): KNEE ARTHROSCOPY WITH PARTIAL MEDIAL MENISECTOMY (Left) CHONDROPLASTY (Left)  Patient Location: PACU  Anesthesia Type:General  Level of Consciousness: awake, alert  and patient cooperative  Airway & Oxygen Therapy: Patient Spontanous Breathing and Patient connected to face mask oxygen  Post-op Assessment: Report given to RN and Post -op Vital signs reviewed and stable  Post vital signs: Reviewed and stable  Last Vitals:  Vitals:   06/12/16 1018 06/12/16 1239  BP: (!) 149/91   Pulse: 65 75  Resp: 18 11  Temp: 36.7 C     Last Pain:  Vitals:   06/12/16 1018  TempSrc: Oral  PainSc: 6       Patients Stated Pain Goal: 2 (06/12/16 1018)  Complications: No apparent anesthesia complications

## 2016-06-12 NOTE — Anesthesia Procedure Notes (Signed)
Procedure Name: LMA Insertion Date/Time: 06/12/2016 11:57 AM Performed by: Curly ShoresRAFT, Ikeem Cleckler W Pre-anesthesia Checklist: Patient identified, Emergency Drugs available, Suction available and Patient being monitored Patient Re-evaluated:Patient Re-evaluated prior to inductionOxygen Delivery Method: Circle system utilized Preoxygenation: Pre-oxygenation with 100% oxygen Intubation Type: IV induction Ventilation: Mask ventilation without difficulty LMA: LMA inserted LMA Size: 4.0 Number of attempts: 1 Airway Equipment and Method: Bite block Placement Confirmation: positive ETCO2 and breath sounds checked- equal and bilateral Tube secured with: Tape Dental Injury: Teeth and Oropharynx as per pre-operative assessment

## 2016-06-13 ENCOUNTER — Encounter (HOSPITAL_BASED_OUTPATIENT_CLINIC_OR_DEPARTMENT_OTHER): Payer: Self-pay | Admitting: Orthopedic Surgery

## 2016-06-13 NOTE — Op Note (Signed)
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 Turner Danielsowan M.D.  Assist: Tomi LikensEric K. Gaylene BrooksPhillips PA-C  (present throughout entire procedure and necessary for timely completion of the procedure) Anes: General LMA  EBL: Minimal  Fluids: 800 cc   Indications:  Patient has catching popping and pain in his left knee.  MRI scan clearly shows large posterior horn medial meniscal tear  As well as some chondromalacia the medial femoral condyle.Pt has failed conservative treatment with anti-inflammatory medicines, physical therapy, and modified activites but did get good temporarily from an intra-articular cortisone injection. Pain has recurred and patient desires elective arthroscopic evaluation and treatment of knee. Risks and benefits of surgery have been discussed and questions answered.  Procedure: Patient identified by arm band and taken to the operating room at the day surgery Center. The appropriate anesthetic monitors were attached, and General LMA anesthesia was induced without difficulty. Lateral post was applied to the table and the lower extremity was prepped and draped in usual sterile fashion from the ankle to the midthigh. Time out procedure was performed. We began the operation by making standard inferior lateral and inferior medial peripatellar portals with a #11 blade allowing introduction of the arthroscope through the inferior lateral portal and the out flow to the inferior medial portal. Pump pressure was set at 100 mmHg and diagnostic arthroscopy  revealed The patellofemoral joint was in good condition.  Moving into the medial compartment there was a partial  bucket-handle tear the posterior horn of the medial meniscus was debrided back to stable margin.  Using straight biters, a 4.2 gray-white sucker shaver and a 3.5 gray-white sucker shaver, this was a white on white tear.  There were also multiple cartilaginous loose bodies some up to 1 cm in length.  These are taken through the outflow, and a couple removed with graspers.  The ACL and PCL were intact.  Lateral compartment was in good condition.. The knee was irrigated out normal saline solution. A dressing of xerofoam 4 x 4 dressing sponges, web roll and an Ace wrap was applied. The patient was awakened extubated and taken to the recovery without difficulty.    Signed: Nestor LewandowskyFrank J Kainon Varady, MD

## 2016-06-25 ENCOUNTER — Other Ambulatory Visit: Payer: Self-pay

## 2016-06-25 MED ORDER — ATORVASTATIN CALCIUM 20 MG PO TABS: 20.0000 mg | ORAL_TABLET | Freq: Every day | ORAL | 0 refills | Status: DC

## 2016-06-25 MED ORDER — LISINOPRIL-HYDROCHLOROTHIAZIDE 20-12.5 MG PO TABS: 1.0000 | ORAL_TABLET | Freq: Every day | ORAL | 0 refills | Status: DC

## 2016-09-05 ENCOUNTER — Other Ambulatory Visit: Payer: Self-pay | Admitting: Family Medicine

## 2016-12-05 ENCOUNTER — Other Ambulatory Visit: Payer: Self-pay | Admitting: Family Medicine

## 2017-03-28 ENCOUNTER — Other Ambulatory Visit: Payer: Self-pay | Admitting: Family Medicine

## 2020-02-10 ENCOUNTER — Encounter (HOSPITAL_BASED_OUTPATIENT_CLINIC_OR_DEPARTMENT_OTHER): Payer: Self-pay | Admitting: Emergency Medicine

## 2020-02-10 ENCOUNTER — Emergency Department (HOSPITAL_BASED_OUTPATIENT_CLINIC_OR_DEPARTMENT_OTHER)
Admission: EM | Admit: 2020-02-10 | Discharge: 2020-02-10 | Disposition: A | Discharge status: Home / Self Care | Payer: Managed Care, Other (non HMO) | Attending: Emergency Medicine | Admitting: Emergency Medicine

## 2020-02-10 ENCOUNTER — Other Ambulatory Visit: Payer: Self-pay

## 2020-02-10 ENCOUNTER — Emergency Department (HOSPITAL_BASED_OUTPATIENT_CLINIC_OR_DEPARTMENT_OTHER): Payer: Managed Care, Other (non HMO)

## 2020-02-10 DIAGNOSIS — H53149 Visual discomfort, unspecified: Secondary | ICD-10-CM | POA: Insufficient documentation

## 2020-02-10 DIAGNOSIS — R519 Headache, unspecified: Secondary | ICD-10-CM | POA: Insufficient documentation

## 2020-02-10 DIAGNOSIS — R930 Abnormal findings on diagnostic imaging of skull and head, not elsewhere classified: Secondary | ICD-10-CM | POA: Diagnosis not present

## 2020-02-10 DIAGNOSIS — H538 Other visual disturbances: Secondary | ICD-10-CM | POA: Diagnosis not present

## 2020-02-10 DIAGNOSIS — I10 Essential (primary) hypertension: Secondary | ICD-10-CM | POA: Insufficient documentation

## 2020-02-10 DIAGNOSIS — Z20822 Contact with and (suspected) exposure to covid-19: Secondary | ICD-10-CM | POA: Insufficient documentation

## 2020-02-10 LAB — CBC WITH DIFFERENTIAL/PLATELET
Abs Immature Granulocytes: 0.01 10*3/uL (ref 0.00–0.07)
Basophils Absolute: 0.1 10*3/uL (ref 0.0–0.1)
Basophils Relative: 1 %
Eosinophils Absolute: 0.1 10*3/uL (ref 0.0–0.5)
Eosinophils Relative: 2 %
HCT: 43.4 % (ref 39.0–52.0)
Hemoglobin: 14.4 g/dL (ref 13.0–17.0)
Immature Granulocytes: 0 %
Lymphocytes Relative: 34 %
Lymphs Abs: 1.9 10*3/uL (ref 0.7–4.0)
MCH: 27.9 pg (ref 26.0–34.0)
MCHC: 33.2 g/dL (ref 30.0–36.0)
MCV: 84.1 fL (ref 80.0–100.0)
Monocytes Absolute: 0.6 10*3/uL (ref 0.1–1.0)
Monocytes Relative: 10 %
Neutro Abs: 3 10*3/uL (ref 1.7–7.7)
Neutrophils Relative %: 53 %
Platelets: 179 10*3/uL (ref 150–400)
RBC: 5.16 MIL/uL (ref 4.22–5.81)
RDW: 12.7 % (ref 11.5–15.5)
WBC: 5.7 10*3/uL (ref 4.0–10.5)
nRBC: 0 % (ref 0.0–0.2)

## 2020-02-10 LAB — BASIC METABOLIC PANEL
Anion gap: 9 (ref 5–15)
BUN: 14 mg/dL (ref 6–20)
CO2: 28 mmol/L (ref 22–32)
Calcium: 8.7 mg/dL — ABNORMAL LOW (ref 8.9–10.3)
Chloride: 102 mmol/L (ref 98–111)
Creatinine, Ser: 0.94 mg/dL (ref 0.61–1.24)
GFR calc Af Amer: >60 mL/min (ref >60)
GFR calc non Af Amer: >60 mL/min (ref >60)
Glucose, Bld: 98 mg/dL (ref 70–99)
Potassium: 3.6 mmol/L (ref 3.5–5.1)
Sodium: 139 mmol/L (ref 135–145)

## 2020-02-10 LAB — SARS CORONAVIRUS 2 BY RT PCR (HOSPITAL ORDER, PERFORMED IN ~~LOC~~ HOSPITAL LAB): SARS Coronavirus 2: NEGATIVE

## 2020-02-10 MED ORDER — SODIUM CHLORIDE 0.9 % IV BOLUS
1000.0000 mL | Freq: Once | INTRAVENOUS | Status: AC
  Administered 2020-02-10: 1000 mL via INTRAVENOUS

## 2020-02-10 MED ORDER — KETOROLAC TROMETHAMINE 30 MG/ML IJ SOLN
30.0000 mg | Freq: Once | INTRAMUSCULAR | Status: AC
  Administered 2020-02-10: 30 mg via INTRAVENOUS
  Filled 2020-02-10: qty 1

## 2020-02-10 NOTE — ED Triage Notes (Signed)
Headache x 3 days, scratchy throat, and weakness.

## 2020-02-10 NOTE — ED Provider Notes (Signed)
[facility] EMERGENCY DEPARTMENT Provider Note  CSN: [id number] Arrival date & time: 02/10/20 1761    History Chief Complaint  Patient presents with  . Headache    HPI  Billy Baker is a 54 y.o. male with no significant PMH reports 3 days of persistent R sided headache, improved some with Goody Powder and Tylenol but returned. He reports some mild blurry vision in R eye, but denies scotoma. Mild photophobia but no nausea or vomiting. Does not typically get headaches. He has had some 'scratchy throat' and general malaise but no fever. 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    Past Medical History:  Diagnosis Date  . Hyperlipidemia   . Hypertension     Past Surgical History:  Procedure Laterality Date  . CHONDROPLASTY Left 06/12/2016   Procedure: CHONDROPLASTY;  Surgeon: Gean Birchwood, MD;  Location: [facility];  Service: Orthopedics;  Laterality: Left;  . KNEE ARTHROSCOPY WITH MEDIAL MENISECTOMY Left 06/12/2016   Procedure: KNEE ARTHROSCOPY WITH PARTIAL MEDIAL MENISECTOMY;  Surgeon: Gean Birchwood, MD;  Location: [facility];  Service: Orthopedics;  Laterality: Left;    Family History  Problem Relation Age of Onset  . Diabetes Mother   . Cancer Father   . Hypertension Father   . Heart disease Maternal Grandfather     Social History   Tobacco Use  . Smoking status: Never Smoker  . Smokeless tobacco: Never Used  Substance Use Topics  . Alcohol use: Yes    Comment: occ  . Drug use: No     Home Medications Prior to Admission medications   Medication Sig Start Date End Date Taking? Authorizing Provider  atorvastatin (LIPITOR) 20 MG tablet Take 1 tablet (20 mg total) by mouth daily. Due for follow up visit & lab work 12/05/16   Rodolph Bong, MD  diclofenac sodium (VOLTAREN) 1 % GEL Apply 4 g topically 4 (four) times daily. To affected joint. 04/16/16   Rodolph Bong, MD   HYDROcodone-acetaminophen (NORCO) 5-325 MG tablet Take 1 tablet by mouth every 6 (six) hours as needed. 06/12/16   Allena Katz, PA-C  lisinopril-hydrochlorothiazide (PRINZIDE,ZESTORETIC) 20-12.5 MG tablet Take 1 tablet by mouth daily. Due for follow up visit 12/05/16   Rodolph Bong, MD     Allergies    Patient has no known allergies.   Review of Systems   Review of Systems A comprehensive review of systems was completed and negative except as noted in HPI.    Physical Exam BP (!) 153/98   Pulse (!) 54   Temp 97.8 F (36.6 C) (Oral)   Resp 16   Ht 5\' 6"  (1.676 m)   Wt 81.6 kg   SpO2 100%   BMI 29.05 kg/m   Physical Exam Vitals and nursing note reviewed.  Constitutional:      Appearance: Normal appearance.  HENT:     Head: Normocephalic and atraumatic.     Nose: Nose normal.     Mouth/Throat:     Mouth: Mucous membranes are moist.  Eyes:     Extraocular Movements: Extraocular movements intact.     Conjunctiva/sclera: Conjunctivae normal.     Pupils: Pupils are equal, round, and reactive to light.  Cardiovascular:     Rate and Rhythm: Normal rate.  Pulmonary:     Effort: Pulmonary effort is normal.     Breath sounds: Normal breath sounds.  Abdominal:  General: Abdomen is flat.     Palpations: Abdomen is soft.     Tenderness: There is no abdominal tenderness.  Musculoskeletal:        General: No swelling. Normal range of motion.     Cervical back: Neck supple.  Skin:    General: Skin is warm and dry.  Neurological:     General: No focal deficit present.     Mental Status: He is alert.     Cranial Nerves: No cranial nerve deficit or facial asymmetry.     Sensory: No sensory deficit.     Motor: No weakness.     Gait: Gait normal.  Psychiatric:        Mood and Affect: Mood normal.      ED Results / Procedures / Treatments   Labs (all labs ordered are listed, but only abnormal results are displayed) Labs Reviewed  BASIC METABOLIC PANEL -  Abnormal; Notable for the following components:      Result Value   Calcium 8.7 (*)    All other components within normal limits  SARS CORONAVIRUS 2 BY RT PCR (HOSPITAL ORDER, PERFORMED IN [facility])  CBC WITH DIFFERENTIAL/PLATELET    EKG None   Radiology CT Head Wo Contrast  Result Date: 02/10/2020 CLINICAL DATA:  Headache and blurred vision EXAM: CT HEAD WITHOUT CONTRAST TECHNIQUE: Contiguous axial images were obtained from the base of the skull through the vertex without intravenous contrast. COMPARISON:  None. FINDINGS: Brain: Ventricles and sulci are normal in size and configuration. There is ossification along the falx, a finding not felt to have clinical significance. There is no intracranial mass, hemorrhage extra-axial fluid collection, or midline shift. There is decreased attenuation in the centrum semiovale on the left medially adjacent to the frontal horn of the left lateral ventricle, immediately superior to the left basal ganglia. Elsewhere brain parenchyma appears unremarkable. Vascular: No hyperdense vessel. There is calcification in each carotid siphon region. Skull: Bony calvarium appears intact. Sinuses/Orbits: There is mucosal thickening in several ethmoid air cells. Other visualized paranasal sinuses are clear. Visualized orbits appear symmetric bilaterally. Other: Mastoid air cells are clear. IMPRESSION: 1. Focal decreased attenuation in anterior, inferior left centrum semiovale. A recent and potentially acute small infarct in this area is questioned. Elsewhere brain parenchyma appears unremarkable. No mass or hemorrhage. 2.  There are foci of calcification in each carotid siphon region. 3.  Mucosal thickening noted in several ethmoid air cells. Electronically Signed   By: Bretta Bang III M.D.   On: 02/10/2020 10:10    Procedures Procedures  Medications Ordered in the ED Medications  sodium chloride 0.9 % bolus 1,000 mL (1,000 mLs Intravenous New  Bag/Given 02/10/20 0954)  ketorolac (TORADOL) 30 MG/ML injection 30 mg (30 mg Intravenous Given 02/10/20 0950)     MDM Rules/Calculators/A&P MDM Patient with new headache, ongoing for several days. Doubt SAH, will check head CT for new mass. Labs for CBC and BMP. Give IVF and Toradol for symptom relief and reassess.  ED Course  I have reviewed the triage vital signs and the nursing notes.  Pertinent labs & imaging results that were available during my care of the patient were reviewed by me and considered in my medical decision making (see chart for details).  Clinical Course as of Feb 10 1200  Thu Feb 10, 2020  1012 CBC is normal   [CS]  1014 Head CT images and results reviewed. Will discuss with Neurology on call.    [  CS]  1016 BMP normal.    [CS]  1039 Discussed CT images with Dr. Wilford Corner, Neurologist who recommends the patient have an MRI. Does not think the CT findings would explain his headache however. Will also add Covid swab given vague viral symptoms although he has been vaccinated.    [CS]  1041 Discussed CT results with the patient, he states [facility] actually [facility]. He is feeling better from a headache stand point and would rather go home today than be transferred to [facility] for MRI.    [CS]    Clinical Course User Index [CS] Pollyann Savoy, MD    Final Clinical Impression(s) / ED Diagnoses Final diagnoses:  Acute nonintractable headache, unspecified headache type  Abnormal CT of the head    Rx / DC Orders ED Discharge Orders    None       Pollyann Savoy, MD 02/10/20 1202

## 2021-09-13 IMAGING — CT CT HEAD W/O CM
3 series · 15 of 47 positions shown, 18 images · non-contrast
Comparison: None.

CLINICAL DATA: Headache and blurred vision

EXAM:
CT HEAD WITHOUT CONTRAST
TECHNIQUE: Contiguous axial images were obtained from the base of the skull
through the vertex without intravenous contrast.

[Series 2: head wo · axial · 0.43mm/px · z∈[-156,-26]mm · 9 of 32 slices shown, 12 images]
[im 3/32  brain]
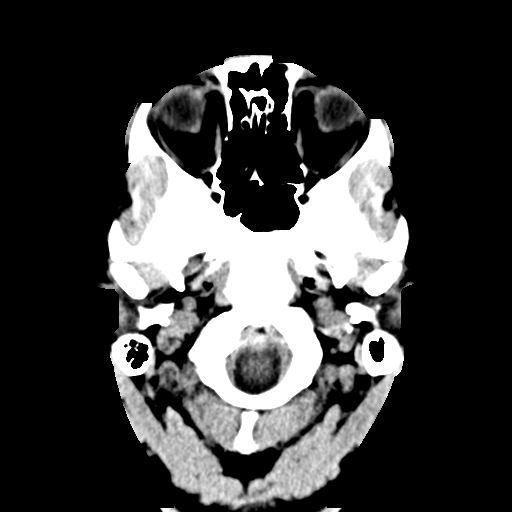
[im 3/32  bone]
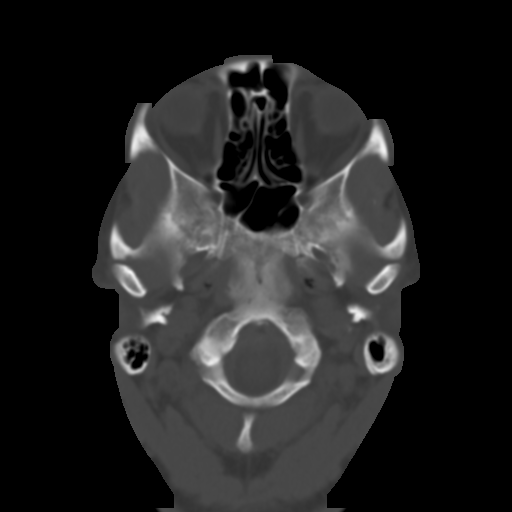
[im 6/32  brain]
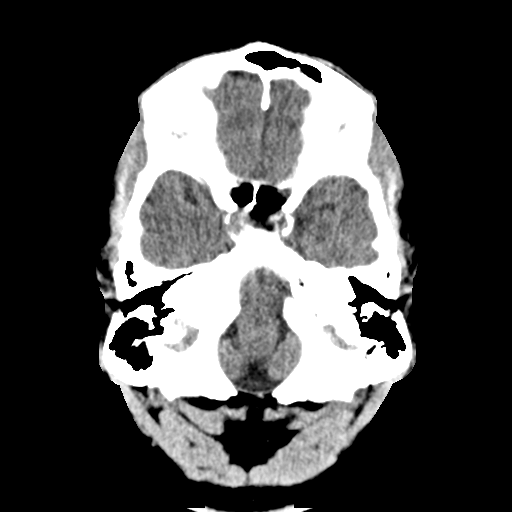
[im 9/32  brain]
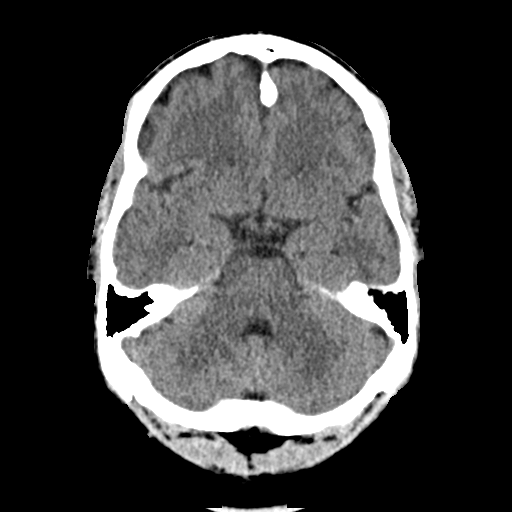
[im 12/32  brain]
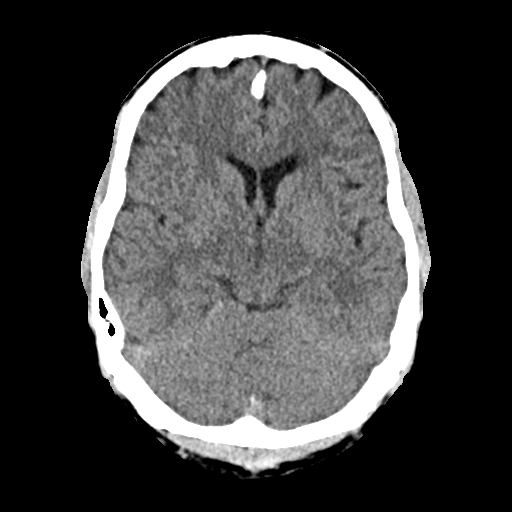
[im 17/32  brain]
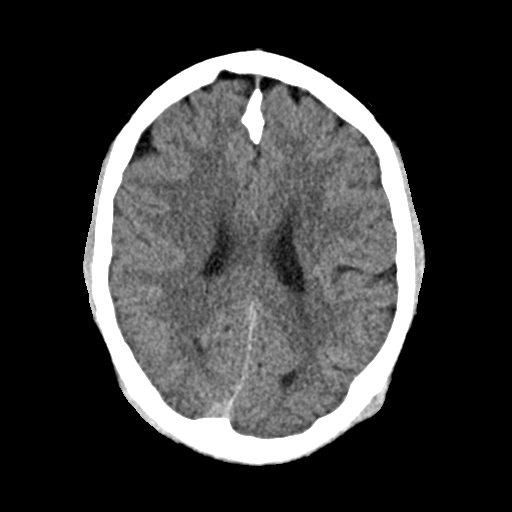
[im 17/32  bone]
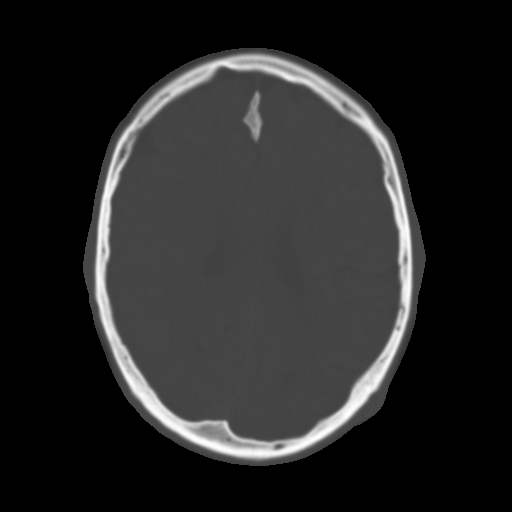
[im 20/32  brain]
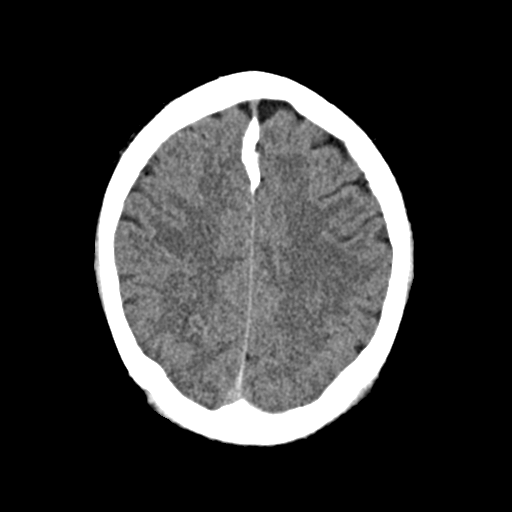
[im 23/32  brain]
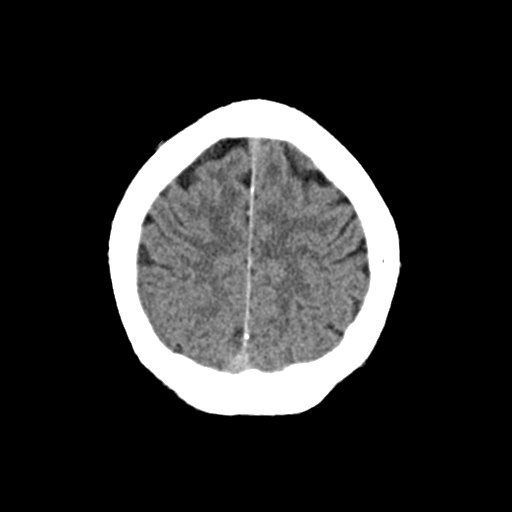
[im 26/32  brain]
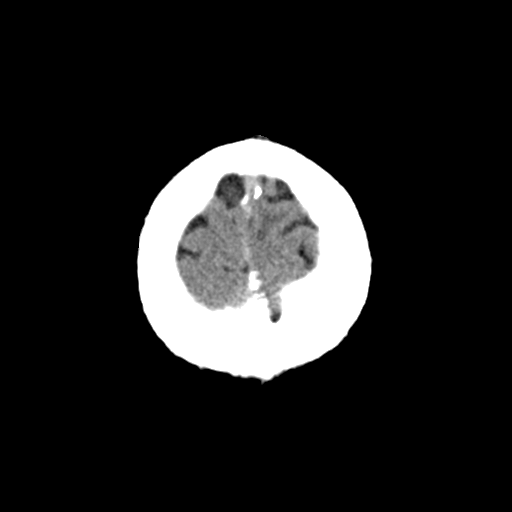
[im 29/32  brain]
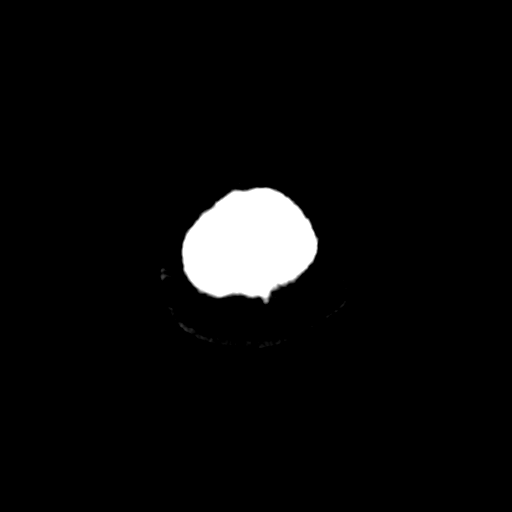
[im 29/32  bone]
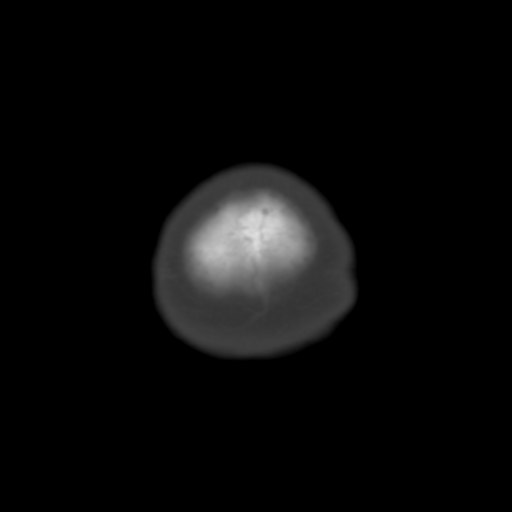

[Series 4: coronal soft · coronal · 0.32mm/px · 3 of 70 slices shown]
[im 24/70  brain]
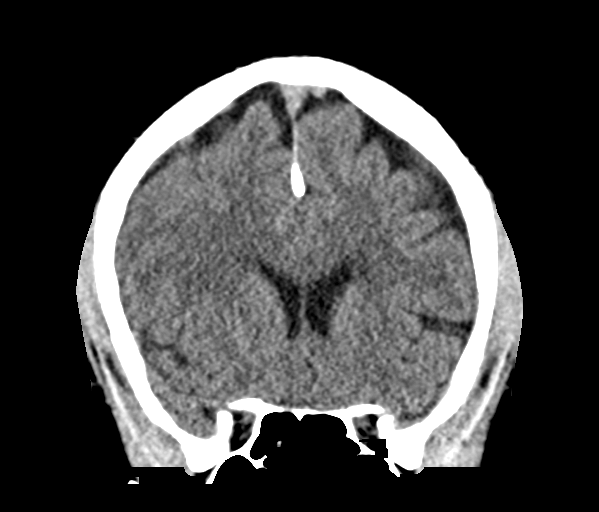
[im 31/70  brain]
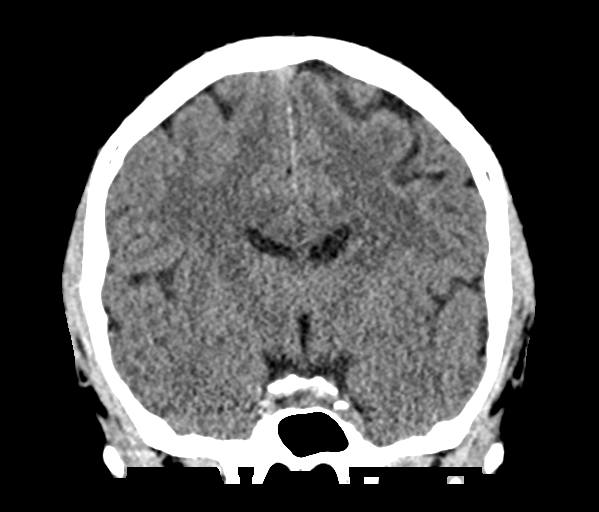
[im 39/70  brain]
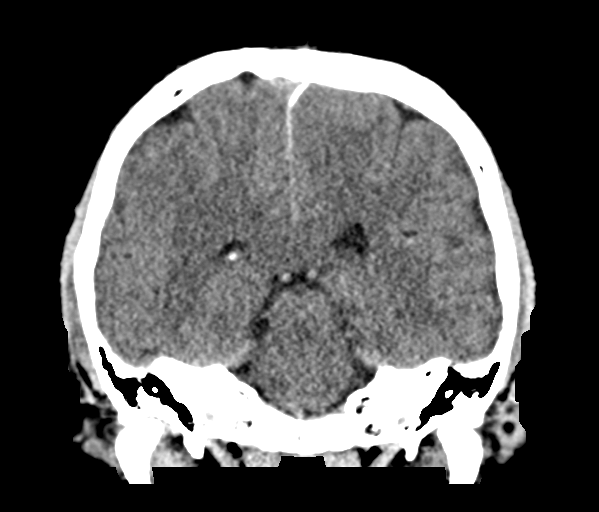

[Series 5: sag soft · sagittal · 0.33mm/px · 3 of 58 slices shown]
[im 20/58  brain]
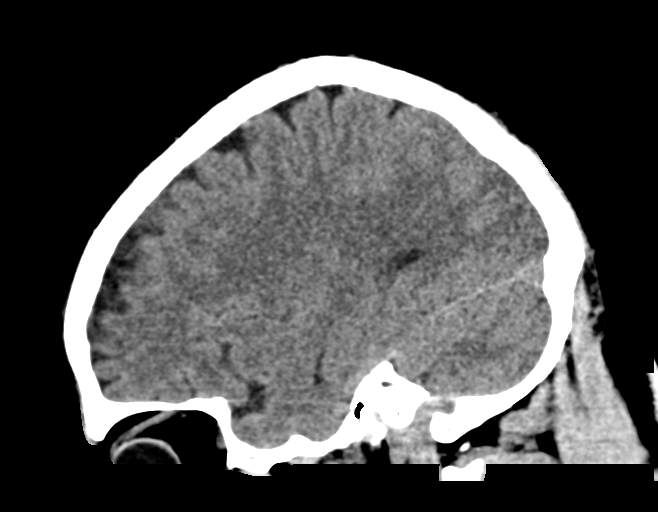
[im 29/58  brain]
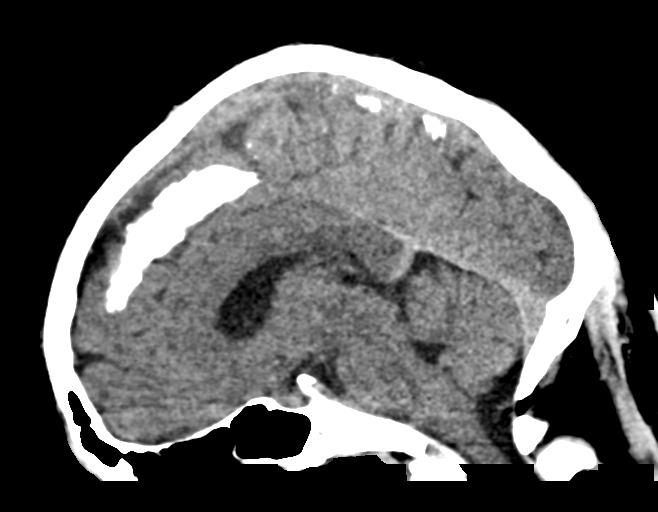
[im 39/58  brain]
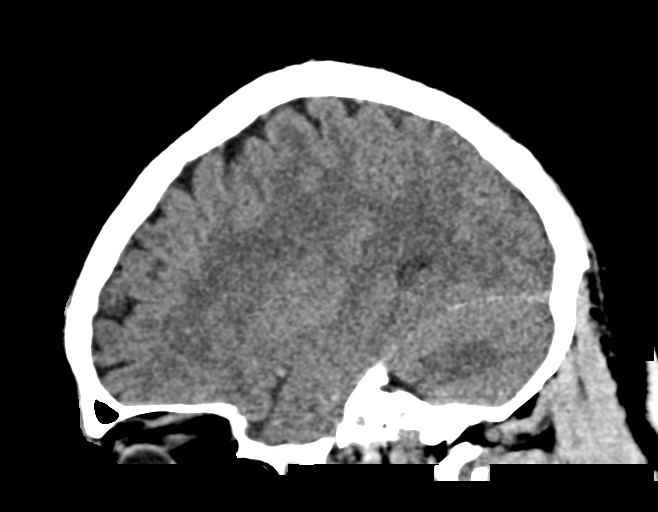

[15 of 47 positions shown; findings below may reference images not displayed]

FINDINGS: Brain: Ventricles and sulci are normal in size and configuration.
There is ossification along the falx, a finding not felt to have
clinical significance. There is no intracranial mass, hemorrhage
extra-axial fluid collection, or midline shift. There is decreased
attenuation in the centrum semiovale on the left medially adjacent
to the frontal horn of the left lateral ventricle, immediately
superior to the left basal ganglia. Elsewhere brain parenchyma
appears unremarkable.

Vascular: No hyperdense vessel. There is calcification in each
carotid siphon region.

Skull: Bony calvarium appears intact.

Sinuses/Orbits: There is mucosal thickening in several ethmoid air
cells. Other visualized paranasal sinuses are clear. Visualized
orbits appear symmetric bilaterally.

Other: Mastoid air cells are clear.
IMPRESSION: 1. Focal decreased attenuation in anterior, inferior left centrum
semiovale. A recent and potentially acute small infarct in this area
is questioned. Elsewhere brain parenchyma appears unremarkable. No
mass or hemorrhage.

2.  There are foci of calcification in each carotid siphon region.

3.  Mucosal thickening noted in several ethmoid air cells.

## 2023-02-15 ENCOUNTER — Other Ambulatory Visit: Payer: Self-pay

## 2023-02-15 ENCOUNTER — Emergency Department (HOSPITAL_BASED_OUTPATIENT_CLINIC_OR_DEPARTMENT_OTHER): Payer: Managed Care, Other (non HMO)

## 2023-02-15 ENCOUNTER — Inpatient Hospital Stay (HOSPITAL_BASED_OUTPATIENT_CLINIC_OR_DEPARTMENT_OTHER)
Admission: EM | Admit: 2023-02-15 | Discharge: 2023-02-19 | DRG: 287 | Disposition: A | Discharge status: Home / Self Care | Payer: Managed Care, Other (non HMO) | Attending: Internal Medicine | Admitting: Internal Medicine

## 2023-02-15 ENCOUNTER — Encounter (HOSPITAL_BASED_OUTPATIENT_CLINIC_OR_DEPARTMENT_OTHER): Payer: Self-pay | Admitting: Emergency Medicine

## 2023-02-15 DIAGNOSIS — Z7982 Long term (current) use of aspirin: Secondary | ICD-10-CM

## 2023-02-15 DIAGNOSIS — G4733 Obstructive sleep apnea (adult) (pediatric): Secondary | ICD-10-CM | POA: Diagnosis present

## 2023-02-15 DIAGNOSIS — G4440 Drug-induced headache, not elsewhere classified, not intractable: Secondary | ICD-10-CM | POA: Diagnosis not present

## 2023-02-15 DIAGNOSIS — Z87448 Personal history of other diseases of urinary system: Secondary | ICD-10-CM

## 2023-02-15 DIAGNOSIS — Z833 Family history of diabetes mellitus: Secondary | ICD-10-CM

## 2023-02-15 DIAGNOSIS — Z8249 Family history of ischemic heart disease and other diseases of the circulatory system: Secondary | ICD-10-CM

## 2023-02-15 DIAGNOSIS — K219 Gastro-esophageal reflux disease without esophagitis: Secondary | ICD-10-CM | POA: Diagnosis present

## 2023-02-15 DIAGNOSIS — I25110 Atherosclerotic heart disease of native coronary artery with unstable angina pectoris: Secondary | ICD-10-CM | POA: Diagnosis not present

## 2023-02-15 DIAGNOSIS — E669 Obesity, unspecified: Secondary | ICD-10-CM | POA: Diagnosis present

## 2023-02-15 DIAGNOSIS — I2510 Atherosclerotic heart disease of native coronary artery without angina pectoris: Secondary | ICD-10-CM | POA: Diagnosis present

## 2023-02-15 DIAGNOSIS — I10 Essential (primary) hypertension: Secondary | ICD-10-CM | POA: Diagnosis present

## 2023-02-15 DIAGNOSIS — E785 Hyperlipidemia, unspecified: Secondary | ICD-10-CM | POA: Diagnosis present

## 2023-02-15 DIAGNOSIS — T463X5A Adverse effect of coronary vasodilators, initial encounter: Secondary | ICD-10-CM | POA: Diagnosis not present

## 2023-02-15 DIAGNOSIS — R001 Bradycardia, unspecified: Secondary | ICD-10-CM | POA: Diagnosis present

## 2023-02-15 DIAGNOSIS — R079 Chest pain, unspecified: Secondary | ICD-10-CM | POA: Diagnosis present

## 2023-02-15 DIAGNOSIS — F4312 Post-traumatic stress disorder, chronic: Secondary | ICD-10-CM | POA: Diagnosis present

## 2023-02-15 DIAGNOSIS — Z79899 Other long term (current) drug therapy: Secondary | ICD-10-CM

## 2023-02-15 DIAGNOSIS — R7303 Prediabetes: Secondary | ICD-10-CM | POA: Diagnosis present

## 2023-02-15 DIAGNOSIS — Z6827 Body mass index (BMI) 27.0-27.9, adult: Secondary | ICD-10-CM

## 2023-02-15 DIAGNOSIS — Z87891 Personal history of nicotine dependence: Secondary | ICD-10-CM

## 2023-02-15 DIAGNOSIS — K5190 Ulcerative colitis, unspecified, without complications: Secondary | ICD-10-CM | POA: Diagnosis present

## 2023-02-15 DIAGNOSIS — I2089 Other forms of angina pectoris: Principal | ICD-10-CM

## 2023-02-15 DIAGNOSIS — F419 Anxiety disorder, unspecified: Secondary | ICD-10-CM | POA: Diagnosis present

## 2023-02-15 DIAGNOSIS — F4321 Adjustment disorder with depressed mood: Secondary | ICD-10-CM | POA: Diagnosis present

## 2023-02-15 LAB — CBC
HCT: 41.4 % (ref 39.0–52.0)
Hemoglobin: 14 g/dL (ref 13.0–17.0)
MCH: 27.9 pg (ref 26.0–34.0)
MCHC: 33.8 g/dL (ref 30.0–36.0)
MCV: 82.6 fL (ref 80.0–100.0)
Platelets: 173 10*3/uL (ref 150–400)
RBC: 5.01 MIL/uL (ref 4.22–5.81)
RDW: 13 % (ref 11.5–15.5)
WBC: 4.8 10*3/uL (ref 4.0–10.5)
nRBC: 0 % (ref 0.0–0.2)

## 2023-02-15 LAB — BASIC METABOLIC PANEL
Anion gap: 10 (ref 5–15)
BUN: 20 mg/dL (ref 6–20)
CO2: 25 mmol/L (ref 22–32)
Calcium: 8.8 mg/dL — ABNORMAL LOW (ref 8.9–10.3)
Chloride: 102 mmol/L (ref 98–111)
Creatinine, Ser: 0.84 mg/dL (ref 0.61–1.24)
GFR, Estimated: >60 mL/min (ref >60)
Glucose, Bld: 123 mg/dL — ABNORMAL HIGH (ref 70–99)
Potassium: 3.4 mmol/L — ABNORMAL LOW (ref 3.5–5.1)
Sodium: 137 mmol/L (ref 135–145)

## 2023-02-15 LAB — TROPONIN I (HIGH SENSITIVITY)
Troponin I (High Sensitivity): 5 ng/L (ref <18)
Troponin I (High Sensitivity): 6 ng/L (ref <18)

## 2023-02-15 MED ORDER — NITROGLYCERIN 0.4 MG SL SUBL
0.4000 mg | SUBLINGUAL_TABLET | SUBLINGUAL | Status: DC | PRN
  Administered 2023-02-15 (×3): 0.4 mg via SUBLINGUAL
  Filled 2023-02-15 (×3): qty 1

## 2023-02-15 MED ORDER — MORPHINE SULFATE (PF) 4 MG/ML IV SOLN
4.0000 mg | Freq: Once | INTRAVENOUS | Status: AC
  Administered 2023-02-16: 4 mg via INTRAVENOUS
  Filled 2023-02-15: qty 1

## 2023-02-15 MED ORDER — ASPIRIN 81 MG PO CHEW
324.0000 mg | CHEWABLE_TABLET | Freq: Once | ORAL | Status: AC
  Administered 2023-02-15: 324 mg via ORAL
  Filled 2023-02-15: qty 4

## 2023-02-15 NOTE — Progress Notes (Signed)
ANTICOAGULATION CONSULT NOTE - Initial Consult  Pharmacy Consult for heparin Indication: chest pain/ACS  No Known Allergies  Patient Measurements: Weight: 77.1 kg (170 lb) Heparin Dosing Weight: 77.1 kg  Vital Signs: Temp: 97.9 F (36.6 C) (07/20 2106) BP: 143/89 (07/20 2330) Pulse Rate: 64 (07/20 2330)  Labs: Recent Labs    02/15/23 2112 02/15/23 2118 02/15/23 2119  HGB  --   --  14.0  HCT  --   --  41.4  PLT  --   --  173  CREATININE  --  0.84  --   TROPONINIHS 5  --   --     CrCl cannot be calculated (Unknown ideal weight.).   Medical History: Past Medical History:  Diagnosis Date   Hyperlipidemia    Hypertension    Assessment: 56 yoM who presented with chest pain and hx of CAD (small right PDA by [facility] in 2022). Pharmacy consulted to dose heparin for ACS. Trops 5>6, CBC stable, and no PTA anticoagulation noted.  Goal of Therapy:  Heparin level 0.3-0.7 units/ml Monitor platelets by anticoagulation protocol: Yes   Plan:  Give 4000 units bolus x 1 Start heparin infusion at 900 units/hr Check anti-Xa level in 6 hours and daily while on heparin Continue to monitor H&H and platelets  Arabella Merles, PharmD. Clinical Pharmacist 02/16/2023 12:26 AM

## 2023-02-15 NOTE — ED Notes (Signed)
Patient resting quietly in stretcher, respirations even, unlabored, no acute distress noted. Denies needs at this time.  

## 2023-02-15 NOTE — ED Provider Notes (Signed)
[facility] Provider Note   CSN: [id number] Arrival date & time: 02/15/23  2056     History {Add pertinent medical, surgical, social history, OB history to HPI:1} No chief complaint on file.   Billy Baker is a 57 y.o. male.  HPI     7PM  Home Medications Prior to Admission medications   Medication Sig Start Date End Date Taking? Authorizing Provider  atorvastatin (LIPITOR) 20 MG tablet Take 1 tablet (20 mg total) by mouth daily. Due for follow up visit & lab work 12/05/16   Rodolph Bong, MD  diclofenac sodium (VOLTAREN) 1 % GEL Apply 4 g topically 4 (four) times daily. To affected joint. 04/16/16   Rodolph Bong, MD  HYDROcodone-acetaminophen (NORCO) 5-325 MG tablet Take 1 tablet by mouth every 6 (six) hours as needed. 06/12/16   Allena Katz, PA-C  lisinopril-hydrochlorothiazide (PRINZIDE,ZESTORETIC) 20-12.5 MG tablet Take 1 tablet by mouth daily. Due for follow up visit 12/05/16   Rodolph Bong, MD      Allergies    Patient has no known allergies.    Review of Systems   Review of Systems  Physical Exam Updated Vital Signs BP 123/81   Pulse 62   Temp 97.9 F (36.6 C)   Resp 15   Wt 77.1 kg   SpO2 96%   BMI 27.44 kg/m  Physical Exam  ED Results / Procedures / Treatments   Labs (all labs ordered are listed, but only abnormal results are displayed) Labs Reviewed  BASIC METABOLIC PANEL - Abnormal; Notable for the following components:      Result Value   Potassium 3.4 (*)    Glucose, Bld 123 (*)    Calcium 8.8 (*)    All other components within normal limits  CBC  TROPONIN I (HIGH SENSITIVITY)  TROPONIN I (HIGH SENSITIVITY)    EKG EKG Interpretation Date/Time:  Saturday February 15 2023 21:02:33 EDT Ventricular Rate:  70 PR Interval:  170 QRS Duration:  87 QT Interval:  382 QTC Calculation: 413 R Axis:   37  Text Interpretation: Sinus rhythm Probable left atrial enlargement Posterior infarct, old  Borderline repolarization abnormality No significant change since last tracing Confirmed by Alvira Monday ([id number]) on 02/15/2023 9:44:57 PM  Radiology DG Chest 2 View  Result Date: 02/15/2023 CLINICAL DATA:  Chest pain EXAM: CHEST - 2 VIEW COMPARISON:  Chest x-ray 08/07/2021 FINDINGS: The heart size and mediastinal contours are within normal limits. Both lungs are clear. The visualized skeletal structures are unremarkable. IMPRESSION: No active cardiopulmonary disease. Electronically Signed   By: Darliss Cheney M.D.   On: 02/15/2023 21:51    Procedures Procedures  {Document cardiac monitor, telemetry assessment procedure when appropriate:1}  Medications Ordered in ED Medications - No data to display  ED Course/ Medical Decision Making/ A&P   {   Click here for ABCD2, HEART and other calculatorsREFRESH Note before signing :1}                          Medical Decision Making Amount and/or Complexity of Data Reviewed Labs: ordered. Radiology: ordered.   ***  {Document critical care time when appropriate:1} {Document review of labs and clinical decision tools ie heart score, Chads2Vasc2 etc:1}  {Document your independent review of radiology images, and any outside records:1} {Document your discussion with family members, caretakers, and with consultants:1} {Document social determinants of health affecting pt's care:1} {Document your decision  making why or why not admission, treatments were needed:1} Final Clinical Impression(s) / ED Diagnoses Final diagnoses:  None    Rx / DC Orders ED Discharge Orders     None

## 2023-02-15 NOTE — ED Notes (Addendum)
Patient resting quietly in stretcher, respirations even, unlabored, no acute distress noted. Denies needs at this time.  Patient reports episode of SOB and chest tightness with left hand numbness, all symptoms resolved at this time.

## 2023-02-15 NOTE — Progress Notes (Incomplete)
ANTICOAGULATION CONSULT NOTE - Initial Consult  Pharmacy Consult for heparin Indication: chest pain/ACS  No Known Allergies  Patient Measurements: Weight: 77.1 kg (170 lb) Heparin Dosing Weight: 77.1 kg  Vital Signs: Temp: 97.9 F (36.6 C) (07/20 2106) BP: 143/89 (07/20 2330) Pulse Rate: 64 (07/20 2330)  Labs: Recent Labs    02/15/23 2112 02/15/23 2118 02/15/23 2119  HGB  --   --  14.0  HCT  --   --  41.4  PLT  --   --  173  CREATININE  --  0.84  --   TROPONINIHS 5  --   --     CrCl cannot be calculated (Unknown ideal weight.).   Medical History: Past Medical History:  Diagnosis Date  . Hyperlipidemia   . Hypertension     Assessment: *** Goal of Therapy:  {XBJYN:[id number]} {Monitor platelets by anticoagulation protocol:3041561::"Monitor platelets by anticoagulation protocol: Yes"}   Plan:  {HYQM:[id number]}  Billy Baker 02/15/2023,11:56 PM

## 2023-02-15 NOTE — Progress Notes (Signed)
   Patient Name: Billy Baker, Billy Baker DOB: 1966/07/06 MRN: [id number] Transferring facility: [facility] Requesting provider: Dalene Seltzer, MD Reason for transfer: CP 57 yo AAM with of HTN,  presents to ER with CP. took NTG at home  hx of obstructive CAD(small right PDA by [facility] in 2022). 1st troponin negative. EDP will call cards. pt received ASA. CP free in ER. Going to[facility] Admission Status: OBS Bed Type: cardiac tele To Do: call cards fellow to see patient on arrival.  [facility] will assume care on arrival to accepting facility. Until arrival, medical decision making responsibilities remain with the EDP.  However, TRH available 24/7 for questions and assistance.   Nursing staff please page [facility] Admits and Consults [phone]) as soon as the patient arrives to the hospital.  Carollee Herter, DO [facility]

## 2023-02-15 NOTE — ED Triage Notes (Addendum)
Presents for chest pain, pressure and shob, fatigue that started today after getting home from work, started after walking up 2 flights of stairs. Central chest, radiates to L arm.   Has required cardiac cath without stent 90mo ago, blockages found but not eligible for a stent. Taking beta blocker.  H/o pulmonary congestion, htn  Tried nitroglycerin x 1 at home without any relief, no ASA at home today, not on a thinner

## 2023-02-15 NOTE — ED Notes (Signed)
Returned from xray

## 2023-02-16 ENCOUNTER — Other Ambulatory Visit: Payer: Self-pay

## 2023-02-16 DIAGNOSIS — K219 Gastro-esophageal reflux disease without esophagitis: Secondary | ICD-10-CM | POA: Diagnosis present

## 2023-02-16 DIAGNOSIS — Z833 Family history of diabetes mellitus: Secondary | ICD-10-CM | POA: Diagnosis not present

## 2023-02-16 DIAGNOSIS — G4733 Obstructive sleep apnea (adult) (pediatric): Secondary | ICD-10-CM

## 2023-02-16 DIAGNOSIS — I200 Unstable angina: Secondary | ICD-10-CM | POA: Diagnosis not present

## 2023-02-16 DIAGNOSIS — R079 Chest pain, unspecified: Secondary | ICD-10-CM | POA: Diagnosis present

## 2023-02-16 DIAGNOSIS — Z8249 Family history of ischemic heart disease and other diseases of the circulatory system: Secondary | ICD-10-CM | POA: Diagnosis not present

## 2023-02-16 DIAGNOSIS — G4440 Drug-induced headache, not elsewhere classified, not intractable: Secondary | ICD-10-CM | POA: Diagnosis not present

## 2023-02-16 DIAGNOSIS — Z87891 Personal history of nicotine dependence: Secondary | ICD-10-CM | POA: Diagnosis not present

## 2023-02-16 DIAGNOSIS — Z7982 Long term (current) use of aspirin: Secondary | ICD-10-CM | POA: Diagnosis not present

## 2023-02-16 DIAGNOSIS — I2510 Atherosclerotic heart disease of native coronary artery without angina pectoris: Secondary | ICD-10-CM

## 2023-02-16 DIAGNOSIS — F419 Anxiety disorder, unspecified: Secondary | ICD-10-CM

## 2023-02-16 DIAGNOSIS — I10 Essential (primary) hypertension: Secondary | ICD-10-CM | POA: Diagnosis present

## 2023-02-16 DIAGNOSIS — K5190 Ulcerative colitis, unspecified, without complications: Secondary | ICD-10-CM | POA: Diagnosis present

## 2023-02-16 DIAGNOSIS — R7303 Prediabetes: Secondary | ICD-10-CM | POA: Diagnosis present

## 2023-02-16 DIAGNOSIS — E669 Obesity, unspecified: Secondary | ICD-10-CM | POA: Diagnosis present

## 2023-02-16 DIAGNOSIS — K51919 Ulcerative colitis, unspecified with unspecified complications: Secondary | ICD-10-CM

## 2023-02-16 DIAGNOSIS — F4312 Post-traumatic stress disorder, chronic: Secondary | ICD-10-CM

## 2023-02-16 DIAGNOSIS — T463X5A Adverse effect of coronary vasodilators, initial encounter: Secondary | ICD-10-CM | POA: Diagnosis not present

## 2023-02-16 DIAGNOSIS — Z87448 Personal history of other diseases of urinary system: Secondary | ICD-10-CM | POA: Diagnosis not present

## 2023-02-16 DIAGNOSIS — R001 Bradycardia, unspecified: Secondary | ICD-10-CM | POA: Diagnosis present

## 2023-02-16 DIAGNOSIS — I25110 Atherosclerotic heart disease of native coronary artery with unstable angina pectoris: Secondary | ICD-10-CM | POA: Diagnosis present

## 2023-02-16 DIAGNOSIS — F4321 Adjustment disorder with depressed mood: Secondary | ICD-10-CM

## 2023-02-16 DIAGNOSIS — Z79899 Other long term (current) drug therapy: Secondary | ICD-10-CM | POA: Diagnosis not present

## 2023-02-16 DIAGNOSIS — E785 Hyperlipidemia, unspecified: Secondary | ICD-10-CM

## 2023-02-16 DIAGNOSIS — Z6827 Body mass index (BMI) 27.0-27.9, adult: Secondary | ICD-10-CM | POA: Diagnosis not present

## 2023-02-16 DIAGNOSIS — K2100 Gastro-esophageal reflux disease with esophagitis, without bleeding: Secondary | ICD-10-CM | POA: Diagnosis not present

## 2023-02-16 LAB — BASIC METABOLIC PANEL
Anion gap: 14 (ref 5–15)
BUN: 17 mg/dL (ref 6–20)
CO2: 24 mmol/L (ref 22–32)
Calcium: 8.8 mg/dL — ABNORMAL LOW (ref 8.9–10.3)
Chloride: 100 mmol/L (ref 98–111)
Creatinine, Ser: 0.92 mg/dL (ref 0.61–1.24)
GFR, Estimated: >60 mL/min (ref >60)
Glucose, Bld: 110 mg/dL — ABNORMAL HIGH (ref 70–99)
Potassium: 3.8 mmol/L (ref 3.5–5.1)
Sodium: 138 mmol/L (ref 135–145)

## 2023-02-16 LAB — CBC
HCT: 43.5 % (ref 39.0–52.0)
Hemoglobin: 14.2 g/dL (ref 13.0–17.0)
MCH: 27.4 pg (ref 26.0–34.0)
MCHC: 32.6 g/dL (ref 30.0–36.0)
MCV: 83.8 fL (ref 80.0–100.0)
Platelets: 136 10*3/uL — ABNORMAL LOW (ref 150–400)
RBC: 5.19 MIL/uL (ref 4.22–5.81)
RDW: 13 % (ref 11.5–15.5)
WBC: 4.9 10*3/uL (ref 4.0–10.5)
nRBC: 0 % (ref 0.0–0.2)

## 2023-02-16 LAB — TROPONIN I (HIGH SENSITIVITY)
Troponin I (High Sensitivity): 19 ng/L — ABNORMAL HIGH (ref <18)
Troponin I (High Sensitivity): 19 ng/L — ABNORMAL HIGH (ref <18)

## 2023-02-16 LAB — HEPARIN LEVEL (UNFRACTIONATED)
Heparin Unfractionated: 0.52 IU/mL (ref 0.30–0.70)
Heparin Unfractionated: 0.5 IU/mL (ref 0.30–0.70)

## 2023-02-16 LAB — MAGNESIUM: Magnesium: 2.1 mg/dL (ref 1.7–2.4)

## 2023-02-16 LAB — HIV ANTIBODY (ROUTINE TESTING W REFLEX): HIV Screen 4th Generation wRfx: NONREACTIVE

## 2023-02-16 MED ORDER — POTASSIUM CHLORIDE CRYS ER 20 MEQ PO TBCR
40.0000 meq | EXTENDED_RELEASE_TABLET | Freq: Once | ORAL | Status: AC
  Administered 2023-02-16: 40 meq via ORAL
  Filled 2023-02-16: qty 2

## 2023-02-16 MED ORDER — HEPARIN (PORCINE) 25000 UT/250ML-% IV SOLN
900.0000 [IU]/h | INTRAVENOUS | Status: DC
  Administered 2023-02-16 (×2): 900 [IU]/h via INTRAVENOUS
  Filled 2023-02-16 (×2): qty 250

## 2023-02-16 MED ORDER — ASPIRIN 81 MG PO CHEW
81.0000 mg | CHEWABLE_TABLET | ORAL | Status: AC
  Administered 2023-02-17: 81 mg via ORAL
  Filled 2023-02-16: qty 1

## 2023-02-16 MED ORDER — NITROGLYCERIN IN D5W 200-5 MCG/ML-% IV SOLN
0.0000 ug/min | INTRAVENOUS | Status: DC
  Administered 2023-02-16: 5 ug/min via INTRAVENOUS
  Administered 2023-02-17 (×2): 40 ug/min via INTRAVENOUS
  Administered 2023-02-18: 50 ug/min via INTRAVENOUS
  Filled 2023-02-16 (×3): qty 250

## 2023-02-16 MED ORDER — CARVEDILOL 12.5 MG PO TABS
12.5000 mg | ORAL_TABLET | Freq: Two times a day (BID) | ORAL | Status: DC
  Administered 2023-02-16 – 2023-02-19 (×6): 12.5 mg via ORAL
  Filled 2023-02-16 (×6): qty 1

## 2023-02-16 MED ORDER — CARVEDILOL 25 MG PO TABS: 25.0000 mg | ORAL_TABLET | Freq: Two times a day (BID) | ORAL | Status: DC

## 2023-02-16 MED ORDER — LOSARTAN POTASSIUM 50 MG PO TABS
100.0000 mg | ORAL_TABLET | Freq: Every day | ORAL | Status: DC
  Administered 2023-02-16 – 2023-02-19 (×4): 100 mg via ORAL
  Filled 2023-02-16 (×4): qty 2

## 2023-02-16 MED ORDER — SODIUM CHLORIDE 0.9 % WEIGHT BASED INFUSION
3.0000 mL/kg/h | INTRAVENOUS | Status: DC
  Administered 2023-02-17: 3 mL/kg/h via INTRAVENOUS

## 2023-02-16 MED ORDER — HEPARIN BOLUS VIA INFUSION
4000.0000 [IU] | Freq: Once | INTRAVENOUS | Status: AC
  Administered 2023-02-16: 4000 [IU] via INTRAVENOUS

## 2023-02-16 MED ORDER — ACETAMINOPHEN 325 MG PO TABS
650.0000 mg | ORAL_TABLET | ORAL | Status: DC | PRN
  Administered 2023-02-16 – 2023-02-17 (×4): 650 mg via ORAL
  Filled 2023-02-16 (×4): qty 2

## 2023-02-16 MED ORDER — ONDANSETRON HCL 4 MG/2ML IJ SOLN
4.0000 mg | Freq: Four times a day (QID) | INTRAMUSCULAR | Status: DC | PRN
  Administered 2023-02-17: 4 mg via INTRAVENOUS
  Filled 2023-02-16: qty 2

## 2023-02-16 MED ORDER — ASPIRIN 81 MG PO TBEC
81.0000 mg | DELAYED_RELEASE_TABLET | Freq: Every day | ORAL | Status: DC
  Administered 2023-02-18 – 2023-02-19 (×2): 81 mg via ORAL
  Filled 2023-02-16 (×3): qty 1

## 2023-02-16 MED ORDER — SODIUM CHLORIDE 0.9 % WEIGHT BASED INFUSION
1.0000 mL/kg/h | INTRAVENOUS | Status: DC
  Administered 2023-02-17: 1 mL/kg/h via INTRAVENOUS

## 2023-02-16 MED ORDER — HYDROCHLOROTHIAZIDE 25 MG PO TABS: 25.0000 mg | ORAL_TABLET | Freq: Every day | ORAL | Status: DC

## 2023-02-16 MED ORDER — ROSUVASTATIN CALCIUM 20 MG PO TABS
40.0000 mg | ORAL_TABLET | Freq: Every day | ORAL | Status: DC
  Administered 2023-02-16 – 2023-02-19 (×4): 40 mg via ORAL
  Filled 2023-02-16 (×4): qty 2

## 2023-02-16 NOTE — ED Notes (Signed)
Patient resting quietly in stretcher with eyes closed, respirations even, unlabored, no acute distress noted. Wife at bedside.

## 2023-02-16 NOTE — Progress Notes (Signed)
  X-cover Note: EDP reports continued CP. Recommended that pt be started on IV NTG gtts. Pt with known obstructive CAD. Trend troponin   Carollee Herter, DO [facility]

## 2023-02-16 NOTE — Consult Note (Addendum)
Cardiology Consultation   Patient ID: Jacquan Savas MRN: [id number]; DOB: 1965/10/03  Admit date: 02/15/2023 Date of Consult: 02/16/2023  PCP:  Clinic[facility] Providers Cardiologist: [facility]  Patient Profile:   Grier Vu is a 57 y.o. male with a hx of CAD (s/p cath in 03/2021 showing normal LM, minimal LAD disease, 80% mid to distal LCx, mild prox and mid RCA disease and CTO of RDPA with collaterals present and medical management recommended), HTN, HLD and OSA who is being seen 02/16/2023 for the evaluation of chest pain at the request of Dr. Imogene Burn.  History of Present Illness:   Mr. Enns is followed by Cardiology at the [facility] and at the time of his most recent visit in 10/2022 and he denied any recent anginal symptoms at that time. He was continued on his current medications with ASA 81 mg daily, Coreg 12.5 mg twice daily, HCTZ 25 mg daily, Losartan 100 mg daily and Crestor 40 mg daily.  He presented to [facility] on 02/15/2023 for evaluation of chest pain. In talking with the patient and his wife today, he reports he was in his normal state of health until yesterday when he developed acute chest pain while walking up the stairs at his home. Reports chest pressure along his sternal region at that time which radiated into his jawline and left arm. Reports associated dyspnea. He took a NTG without improvement in symptoms but says pain actually resolved a little later but had recurrent pain while proceeding to the ED. He continued to experience intermittent episodes of chest pain while at [facility]. He was started on IV Heparin and IV Nitroglycerin and denies any recurrent episodes of pain since. No recent orthopnea, PND or pitting edema.  Initial labs show WBC 4.8, Hgb 14.9, platelets 173, Na+ 137, K+ 3.4 and creatinine 0.84. Initial and repeat Hs Troponin negative at 5 and 6. CXR with no active cardiopulmonary disease. EKG shows NSR, HR 70 with TWI along the  inferior and lateral leads.   Past Medical History:  Diagnosis Date   Hyperlipidemia    Hypertension     Past Surgical History:  Procedure Laterality Date   CHONDROPLASTY Left 06/12/2016   Procedure: CHONDROPLASTY;  Surgeon: Gean Birchwood, MD;  Location: [facility];  Service: Orthopedics;  Laterality: Left;   KNEE ARTHROSCOPY WITH MEDIAL MENISECTOMY Left 06/12/2016   Procedure: KNEE ARTHROSCOPY WITH PARTIAL MEDIAL MENISECTOMY;  Surgeon: Gean Birchwood, MD;  Location: [facility];  Service: Orthopedics;  Laterality: Left;     Home Medications:  Prior to Admission medications   Medication Sig Start Date End Date Taking? Authorizing Provider  aspirin EC 81 MG tablet Take 81 mg by mouth daily. Swallow whole.   Yes [provider]  carvedilol (COREG) 25 MG tablet Take 25 mg by mouth 2 (two) times daily with a meal.   Yes [provider]  cetirizine (ZYRTEC) 10 MG chewable tablet Chew 10 mg by mouth daily.   Yes [provider]  hydrochlorothiazide (HYDRODIURIL) 25 MG tablet Take 25 mg by mouth daily.   Yes [provider]  losartan (COZAAR) 100 MG tablet Take 100 mg by mouth daily.   Yes [provider]  rosuvastatin (CRESTOR) 40 MG tablet Take 40 mg by mouth daily.   Yes [provider]  valACYclovir (VALTREX) 500 MG tablet Take 500 mg by mouth 2 (two) times daily.   Yes [provider]  diclofenac  sodium (VOLTAREN) 1 % GEL Apply 4 g topically 4 (four) times daily. To affected joint. 04/16/16   Rodolph Bong, MD    Inpatient Medications: Scheduled Meds:  [START ON 02/17/2023] aspirin EC  81 mg Oral Daily   carvedilol  12.5 mg Oral BID WC   losartan  100 mg Oral Daily   potassium chloride  40 mEq Oral Once   rosuvastatin  40 mg Oral Daily   Continuous Infusions:  heparin 900 Units/hr (02/16/23 0040)   nitroGLYCERIN 15 mcg/min (02/16/23 0930)   PRN Meds: acetaminophen, nitroGLYCERIN, ondansetron  (ZOFRAN) IV  Allergies:   No Known Allergies  Social History:   Social History   Socioeconomic History   Marital status: Married    Spouse name: Not on file   Number of children: Not on file   Years of education: Not on file   Highest education level: Not on file  Occupational History   Not on file  Tobacco Use   Smoking status: Never   Smokeless tobacco: Never  Substance and Sexual Activity   Alcohol use: Yes    Comment: occ   Drug use: No   Sexual activity: Not on file  Other Topics Concern   Not on file  Social History Narrative   Not on file   Social Determinants of Health   Financial Resource Strain: Not on file  Food Insecurity: No Food Insecurity (02/16/2023)   Hunger Vital Sign    Worried About Running Out of Food in the Last Year: Never true    Ran Out of Food in the Last Year: Never true  Transportation Needs: No Transportation Needs (02/16/2023)   PRAPARE - Administrator, Civil Service (Medical): No    Lack of Transportation (Non-Medical): No  Physical Activity: Not on file  Stress: Not on file  Social Connections: Not on file  Intimate Partner Violence: Not At Risk (02/16/2023)   Humiliation, Afraid, Rape, and Kick questionnaire    Fear of Current or Ex-Partner: No    Emotionally Abused: No    Physically Abused: No    Sexually Abused: No    Family History:    Family History  Problem Relation Age of Onset   Diabetes Mother    Cancer Father    Hypertension Father    Heart disease Maternal Grandfather      ROS:  Please see the history of present illness.   All other ROS reviewed and negative.     Physical Exam/Data:   Vitals:   02/16/23 1030 02/16/23 1045 02/16/23 1100 02/16/23 1210  BP: 117/89  130/89 124/82  Pulse: (!) 52  71 (!) 56  Resp: 12  16 12   Temp:  97.7 F (36.5 C)    TempSrc:  Oral    SpO2: 95%  99% 100%  Weight:      Height:       No intake or output data in the 24 hours ending 02/16/23 1355    02/15/2023    11:33 PM 02/15/2023    9:10 PM 02/10/2020    8:59 AM  Last 3 Weights  Weight (lbs) 169 lb 15.6 oz 170 lb 180 lb  Weight (kg) 77.1 kg 77.111 kg 81.647 kg     Body mass index is 27.43 kg/m.  General:  Well nourished, well developed male appearing in no acute distress. HEENT: normal Neck: no JVD Vascular: No carotid bruits; Distal pulses 2+ bilaterally Cardiac:  normal S1, S2; RRR; no murmur  Lungs:  clear to auscultation bilaterally, no wheezing, rhonchi or rales  Abd: soft, nontender, no hepatomegaly  Ext: no pitting edema Musculoskeletal:  No deformities, BUE and BLE strength normal and equal Skin: warm and dry  Neuro:  CNs 2-12 intact, no focal abnormalities noted Psych:  Normal affect   EKG:  The EKG was personally reviewed and demonstrates: NSR, HR 70 with TWI along the inferior and lateral leads which is new when compared to 2017.   Telemetry:  Telemetry was personally reviewed and demonstrates: Sinus bradycardia, HR in 50's.   Relevant CV Studies:  Cardiac Catheterization: 03/2021 Coronary Findings Diagnostic Dominance: Right  Left Main: The vessel was visualized by angiography, is moderate in size and is angiographically normal.  Left Anterior Descending: The vessel exhibits minimal luminal irregularities.  Left Circumflex: Mid Cx to Dist Cx lesion is 80% stenosed. First Obtuse Marginal Branch: The vessel is large.  Right Coronary Artery: Prox RCA lesion is 10% stenosed. Mid RCA lesion is 25% stenosed. Right Posterior Descending Artery: RPDA filled by collaterals from Dist LAD. RPDA lesion is 100% stenosed.   Intervention  No interventions have been documented.   Echocardiogram: 12/2021 Transthoracic echocardiogram 01/02/2022 There is mild concentric left ventricular hypertrophy. Ejection Fraction = >55% (Visual Estimation). The left ventricular wall motion is normal. Tissue Doppler sampling consistent with Normal diastolic function.. The right ventricle is normal in  size and function. The aortic valve is trileaflet. Mild aortic regurgitation. The aortic valve pressure half time is 712 msec. Ascending Aorta measures 4.0cm. Right ventricular systolic pressure is normal. The IVC is normal in size with an inspiratory collapse of greater then 50%, suggesting normal right atrial pressure. There is no pericardial effusion.   Laboratory Data:  High Sensitivity Troponin:   Recent Labs  Lab 02/15/23 2112 02/15/23 2312  TROPONINIHS 5 6     Chemistry Recent Labs  Lab 02/15/23 2118  NA 137  K 3.4*  CL 102  CO2 25  GLUCOSE 123*  BUN 20  CREATININE 0.84  CALCIUM 8.8*  GFRNONAA >60  ANIONGAP 10    No results for input(s): "PROT", "ALBUMIN", "AST", "ALT", "ALKPHOS", "BILITOT" in the last 168 hours. Lipids No results for input(s): "CHOL", "TRIG", "HDL", "LABVLDL", "LDLCALC", "CHOLHDL" in the last 168 hours.  Hematology Recent Labs  Lab 02/15/23 2119  WBC 4.8  RBC 5.01  HGB 14.0  HCT 41.4  MCV 82.6  MCH 27.9  MCHC 33.8  RDW 13.0  PLT 173   Thyroid No results for input(s): "TSH", "FREET4" in the last 168 hours.  BNPNo results for input(s): "BNP", "PROBNP" in the last 168 hours.  DDimer No results for input(s): "DDIMER" in the last 168 hours.   Radiology/Studies:  DG Chest 2 View  Result Date: 02/15/2023 CLINICAL DATA:  Chest pain EXAM: CHEST - 2 VIEW COMPARISON:  Chest x-ray 08/07/2021 FINDINGS: The heart size and mediastinal contours are within normal limits. Both lungs are clear. The visualized skeletal structures are unremarkable. IMPRESSION: No active cardiopulmonary disease. Electronically Signed   By: Darliss Cheney M.D.   On: 02/15/2023 21:51     Assessment and Plan:   1. Chest Pain concerning for Angina - Developed chest pressure with radiation into his jaw and arm with recurrent pains while in the ED. Now resolved with IV Heparin and IV NTG.  - Hs Troponin values are negative but EKG does show new TWI along the inferior and  lateral leads when compared to prior tracings in our system.  -  Given his known CAD and presenting symptoms, would anticipate a cardiac catheterization for definitive evaluation. The patient understands that risks include but are not limited to stroke (1 in 1000), death (1 in 1000), kidney failure [usually temporary] (1 in 500), bleeding (1 in 200), allergic reaction [possibly serious] (1 in 200).  - Continue IV Heparin, IV NTG, ASA, Coreg (with dose reduction to 12.5mg  daily) and Crestor 40mg  daily.    2. CAD - He is s/p cath in 03/2021 showing normal LM, minimal LAD disease, 80% mid to distal LCx, mild prox and mid RCA disease and CTO of RDPA with collaterals present and medical management recommended.  - Plan for repeat cath as outlined above.  - Continue ASA, Coreg (reduce dosing to 12.5mg  BID due to bradycardia) and Crestor 40mg  daily. Also on IV Heparin and IV NTG.   3. HTN - SBP currently in the 140's but he has not yet received his home medications. On IV NTG. Scheduled to receive Coreg 25mg  BID, Losartan 100mg  daily and Hydrochlorothiazide 25mg  daily. Given his bradycardia, will reduce Coreg to 12.5mg  BID with hold parameters. Hold Hydrochlorothiazide given cath tomorrow.    4. HLD - Repeat FLP pending. Continue Crestor 40mg  daily.     For questions or updates, please contact [facility] Please consult [email or web address] for contact info under    Signed, Ellsworth Lennox, PA-C  02/16/2023 1:55 PM  Cardiology Attending  Patient seen and examined. Agree with the findings as noted above. The patient presents for evaluation of chest pain. He has known CAD, s/p left heart cath and was in his usual state of health when he presented with typical anginal symptoms. Enzymes are negative but ECG shows new T wave abnormality. The patient denies sob or synope. He was treated with IV NTG and Heparin and his chest pain has resolved. On exam he is a pleasant 57 yo man NAD. Lungs are clear  and CV reveals a RRR. Ext with no edema. Tele sinus brady.  A/P [place] -  We will continue IV heparin and IV NTG. Left heart cath for tomorrow.   Sharlot Gowda Cinderella Christoffersen,MD

## 2023-02-16 NOTE — H&P (Addendum)
History and Physical   Billy Baker UEA:[id number] DOB: 1966-03-06 DOA: 02/15/2023  PCP: [facility]   Patient coming from: Home  Chief Complaint: Chest pain  HPI: Billy Baker is a 57 y.o. male with medical history significant of hypertension, hyperlipidemia, CAD, GERD, CKD, ulcerative colitis, PTSD, depression, anxiety, OSA presenting with chest pain.  Patient was walking up the stairs yesterday when he had onset of chest pain.  Described as central chest pressure that radiates to the left arm.  He had some associated shortness of breath, left arm tingling and some mild lightheadedness.  States his symptoms improved with rest but then returned later.  And had had some waxing and waning symptoms throughout the day prior to presenting to emergency department.  States the pain similar to the pain he had before he had his previous catheterization in 2022, which as below showed some chronic occlusion and otherwise nonobstructive CAD.  He denies fevers, chills, abdominal pain, constipation, diarrhea, nausea, vomiting, diaphoresis.  ED Course: Vital signs in the ED notable for blood pressure in the 100s to 150s systolic, heart rate in the 40s to 70s.  Lab workup included BMP with potassium 3.4, glucose 123, calcium 8.8.  CBC within normal limits.  Troponin negative x 2.  Chest x-ray without acute normality.  EKG with sinus rhythm with some nonspecific T wave flattening and inversions that were different from 2017.  Patient started on nitro drip, heparin drip, morphine, aspirin in the ED.  Cardiology consulted and agreed to see the patient on arrival to [facility] from med center.  Review of Systems: As per HPI otherwise all other systems reviewed and are negative.  Past Medical History:  Diagnosis Date   Hyperlipidemia    Hypertension     Past Surgical History:  Procedure Laterality Date   CHONDROPLASTY Left 06/12/2016   Procedure: CHONDROPLASTY;  Surgeon: Gean Birchwood, MD;  Location:  [facility];  Service: Orthopedics;  Laterality: Left;   KNEE ARTHROSCOPY WITH MEDIAL MENISECTOMY Left 06/12/2016   Procedure: KNEE ARTHROSCOPY WITH PARTIAL MEDIAL MENISECTOMY;  Surgeon: Gean Birchwood, MD;  Location: [facility];  Service: Orthopedics;  Laterality: Left;    Social History  reports that he has never smoked. He has never used smokeless tobacco. He reports current alcohol use. He reports that he does not use drugs.  No Known Allergies  Family History  Problem Relation Age of Onset   Diabetes Mother    Cancer Father    Hypertension Father    Heart disease Maternal Grandfather   Reviewed on admission  Prior to Admission medications   Medication Sig Start Date End Date Taking? Authorizing Provider  aspirin EC 81 MG tablet Take 81 mg by mouth daily. Swallow whole.   Yes [provider]  carvedilol (COREG) 25 MG tablet Take 25 mg by mouth 2 (two) times daily with a meal.   Yes [provider]  cetirizine (ZYRTEC) 10 MG chewable tablet Chew 10 mg by mouth daily.   Yes [provider]  hydrochlorothiazide (HYDRODIURIL) 25 MG tablet Take 25 mg by mouth daily.   Yes [provider]  losartan (COZAAR) 100 MG tablet Take 100 mg by mouth daily.   Yes [provider]  rosuvastatin (CRESTOR) 40 MG tablet Take 40 mg by mouth daily.   Yes [provider]  valACYclovir (VALTREX) 500 MG tablet Take 500 mg by mouth 2 (two) times daily.   Yes [provider]  diclofenac sodium (VOLTAREN)  1 % GEL Apply 4 g topically 4 (four) times daily. To affected joint. 04/16/16   Rodolph Bong, MD    Physical Exam: Vitals:   02/16/23 1030 02/16/23 1045 02/16/23 1100 02/16/23 1210  BP: 117/89  130/89 124/82  Pulse: (!) 52  71 (!) 56  Resp: 12  16 12   Temp:  97.7 F (36.5 C)    TempSrc:  Oral    SpO2: 95%  99% 100%  Weight:      Height:        Physical Exam Constitutional:      General: He is not in acute  distress.    Appearance: Normal appearance.  HENT:     Head: Normocephalic and atraumatic.     Mouth/Throat:     Mouth: Mucous membranes are moist.     Pharynx: Oropharynx is clear.  Eyes:     Extraocular Movements: Extraocular movements intact.     Pupils: Pupils are equal, round, and reactive to light.  Cardiovascular:     Rate and Rhythm: Regular rhythm. Bradycardia present.     Pulses: Normal pulses.     Heart sounds: Normal heart sounds.  Pulmonary:     Effort: Pulmonary effort is normal. No respiratory distress.     Breath sounds: Normal breath sounds.  Abdominal:     General: Bowel sounds are normal. There is no distension.     Palpations: Abdomen is soft.     Tenderness: There is no abdominal tenderness.  Musculoskeletal:        General: No swelling or deformity.  Skin:    General: Skin is warm and dry.  Neurological:     General: No focal deficit present.     Mental Status: Mental status is at baseline.    Labs on Admission: I have personally reviewed following labs and imaging studies  CBC: Recent Labs  Lab 02/15/23 2119  WBC 4.8  HGB 14.0  HCT 41.4  MCV 82.6  PLT 173    Basic Metabolic Panel: Recent Labs  Lab 02/15/23 2118  NA 137  K 3.4*  CL 102  CO2 25  GLUCOSE 123*  BUN 20  CREATININE 0.84  CALCIUM 8.8*    GFR: Estimated Creatinine Clearance: 93.7 mL/min (by C-G formula based on SCr of 0.84 mg/dL).  Liver Function Tests: No results for input(s): "AST", "ALT", "ALKPHOS", "BILITOT", "PROT", "ALBUMIN" in the last 168 hours.  Urine analysis: No results found for: "COLORURINE", "APPEARANCEUR", "LABSPEC", "PHURINE", "GLUCOSEU", "HGBUR", "BILIRUBINUR", "KETONESUR", "PROTEINUR", "UROBILINOGEN", "NITRITE", "LEUKOCYTESUR"  Radiological Exams on Admission: DG Chest 2 View  Result Date: 02/15/2023 CLINICAL DATA:  Chest pain EXAM: CHEST - 2 VIEW COMPARISON:  Chest x-ray 08/07/2021 FINDINGS: The heart size and mediastinal contours are within normal  limits. Both lungs are clear. The visualized skeletal structures are unremarkable. IMPRESSION: No active cardiopulmonary disease. Electronically Signed   By: Darliss Cheney M.D.   On: 02/15/2023 21:51    EKG: Independently reviewed.  Sinus rhythm at 70 bpm.  Nonspecific T wave flattening and inversion in multiple leads.  Questionable V2 repolarization abnormality.  T wave changes are new/different from 2017.  Assessment/Plan Principal Problem:   Chest pain Active Problems:   HTN (hypertension)   HLD (hyperlipidemia)   CAD (coronary artery disease)   GERD (gastroesophageal reflux disease)   Post-traumatic stress disorder, chronic   Obstructive sleep apnea syndrome   Adjustment disorder with depressed mood   Ulcerative colitis (HCC)   Anxiety   Chest pain CAD >  History of CAD presenting with chest pain as per HPI. 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  Follows with cardiology through the [place]. > Chest pain is typical of anginal pain with pressure radiating to left arm, shortness of breath, improvement with rest.  EKG with possible new T wave flattening/inversions different from 2017 which the last I can see in our system. > Troponin negative x 2. > Started on heparin drip and nitroglycerin drip in the ED.  Has received morphine and aspirin as well. > Cardiology consulted in the ED and will see the patient here.  Notified of patient arrival. - Appreciate cardiology recommendations and assistance - Monitor on progressive unit - Continue with heparin drip and nitroglycerin drip for now - Continue home losartan, carvedilol, aspirin, rosuvastatin - Echocardiogram - Possible provocative testing and/or catheterization per cardiology recommendations  Hypertension - Continue home losartan, carvedilol as above - Holding home hydrochlorothiazide for now  Hyperlipidemia - Continue rosuvastatin as above    CKD > Noted in chart, creatinine stable/normal in ED. - Trend renal function and electrolytes  GERD Ulcerative colitis PTSD Depression Anxiety OSA > History of these listed in chart but I do not see any medications ordered for these issues per chart review and history of minimal adherence with CPAP.  DVT prophylaxis: Heparin Code Status:   Full Family Communication:  None on admission Disposition Plan:   Patient is from:  Home  Anticipated DC to:  Home  Anticipated DC date:  1 to 2 days  Anticipated DC barriers: None  Consults called:  Cardiology Admission status:  Inpatient, progressive  Severity of Illness: The appropriate patient status for this patient is INPATIENT. Inpatient status is judged to be reasonable and necessary in order to provide the required intensity of service to ensure the patient's safety. The patient's presenting symptoms, physical exam findings, and initial radiographic and laboratory data in the context of their chronic comorbidities is felt to place them at high risk for further clinical deterioration. Furthermore, it is not anticipated that the patient will be medically stable for discharge from the hospital within 2 midnights of admission.   * I certify that at the point of admission it is my clinical judgment that the patient will require inpatient hospital care spanning beyond 2 midnights from the point of admission due to high intensity of service, high risk for further deterioration and high frequency of surveillance required.Synetta Fail MD [facility]  How to contact the [facility] Attending or Consulting provider 7A - 7P or covering provider during after hours 7P -7A, for this patient?   Check the care team in [facility] and look for a) attending/consulting [facility] provider listed and b) the [facility] team listed Log into [email or web address] and use [facility]'s universal password to access. If you do not have the password, please contact the hospital  operator. Locate the [facility] provider you are looking for under [facility] and page to a number that you can be directly reached. If you still have difficulty reaching the provider, please page the [facility] (Director on Call) for the Hospitalists listed on amion for assistance.  02/16/2023, 12:30 PM

## 2023-02-16 NOTE — Plan of Care (Signed)
Cross Cover Note   Overnight patient having worsening chest pain up to 9/10. Initial ECG with subtile ST elevations in lateral leads (V5, V6). Nitroglycerine gtt uptitrated by nursing with improvement in CP. Repeat ECG with no ST changes. Examined in person, and chest pain improving. Troponins obtained and flat. Agree with continued plan for cath in am.    Achille Rich, MD

## 2023-02-16 NOTE — ED Notes (Signed)
Patient resting quietly in stretcher with eyes closed, respirations even, unlabored, no acute distress noted.  

## 2023-02-16 NOTE — Progress Notes (Addendum)
ANTICOAGULATION CONSULT NOTE - Follow-up Note  Pharmacy Consult for Heparin Indication: chest pain/ACS  No Known Allergies  Patient Measurements: Height: 5\' 6"  (167.6 cm) Weight: 77.1 kg (169 lb 15.6 oz) IBW/kg (Calculated) : 63.8 Heparin Dosing Weight: 77.1 kg  Vital Signs: Temp: 97.5 F (36.4 C) (07/21 0932) Temp Source: Oral (07/21 0932) BP: 117/89 (07/21 1030) Pulse Rate: 52 (07/21 1030)  Labs: Recent Labs    02/15/23 2112 02/15/23 2118 02/15/23 2119 02/15/23 2312 02/16/23 0833  HGB  --   --  14.0  --   --   HCT  --   --  41.4  --   --   PLT  --   --  173  --   --   HEPARINUNFRC  --   --   --   --  0.52  CREATININE  --  0.84  --   --   --   TROPONINIHS 5  --   --  6  --     Estimated Creatinine Clearance: 93.7 mL/min (by C-G formula based on SCr of 0.84 mg/dL).   Medical History: Past Medical History:  Diagnosis Date   Hyperlipidemia    Hypertension     Medications:  (Not in a hospital admission)  Scheduled:  Infusions:   heparin 900 Units/hr (02/16/23 0040)   nitroGLYCERIN 15 mcg/min (02/16/23 0930)   PRN: nitroGLYCERIN  Assessment: 57 yom with a history of HTN, CAD (small right PDA by [facility] in 2022). Patient is presenting with chest pain. Heparin per pharmacy consult placed for chest pain/ACS.  Patient was not on anticoagulation prior to arrival. Started on 900 units/hr IV heparin following 4000 unit IV heparin bolus. Resulting heparin level is 0.52 which is therapeutic.  No issues with infusion or bleeding per RN.  Hgb 14; plt 173 -- not checked this AM  Goal of Therapy:  Heparin level 0.3-0.7 units/ml Monitor platelets by anticoagulation protocol: Yes   Plan:  CBC ordered Continue heparin infusion at 900 units/hr Check anti-Xa level at 1500 and daily while on heparin Continue to monitor H&H and platelets  Delmar Landau, PharmD, BCPS 02/16/2023 10:45 AM ED Clinical Pharmacist -  [phone]

## 2023-02-16 NOTE — Plan of Care (Addendum)
Patient remains on 2C - Cardiac PCU at time of writing. Prior chest pain resolved with NGL infusion. Patient remains on Heparin infusion as well. Ambulatory without assistance. Per the patient, he has not been formally consented by Cards team regarding cath planned for tomorrow; will defer obtaining consent signature until patient has been consented.    Problem: Education: Goal: Knowledge of General Education information will improve Description: Including pain rating scale, medication(s)/side effects and non-pharmacologic comfort measures Outcome: Progressing   Problem: Health Behavior/Discharge Planning: Goal: Ability to manage health-related needs will improve Outcome: Progressing   Problem: Clinical Measurements: Goal: Ability to maintain clinical measurements within normal limits will improve Outcome: Progressing Goal: Will remain free from infection Outcome: Progressing Goal: Diagnostic test results will improve Outcome: Progressing Goal: Respiratory complications will improve Outcome: Progressing Goal: Cardiovascular complication will be avoided Outcome: Progressing   Problem: Activity: Goal: Risk for activity intolerance will decrease Outcome: Progressing   Problem: Nutrition: Goal: Adequate nutrition will be maintained Outcome: Progressing   Problem: Coping: Goal: Level of anxiety will decrease Outcome: Progressing   Problem: Elimination: Goal: Will not experience complications related to bowel motility Outcome: Progressing Goal: Will not experience complications related to urinary retention Outcome: Progressing   Problem: Pain Managment: Goal: General experience of comfort will improve Outcome: Progressing   Problem: Safety: Goal: Ability to remain free from injury will improve Outcome: Progressing   Problem: Skin Integrity: Goal: Risk for impaired skin integrity will decrease Outcome: Progressing   Problem: Education: Goal: Understanding of cardiac  disease, CV risk reduction, and recovery process will improve Outcome: Progressing Goal: Individualized Educational Video(s) Outcome: Progressing   Problem: Activity: Goal: Ability to tolerate increased activity will improve Outcome: Progressing   Problem: Cardiac: Goal: Ability to achieve and maintain adequate cardiovascular perfusion will improve Outcome: Progressing   Problem: Health Behavior/Discharge Planning: Goal: Ability to safely manage health-related needs after discharge will improve Outcome: Progressing   Problem: Education: Goal: Understanding of CV disease, CV risk reduction, and recovery process will improve Outcome: Progressing Goal: Individualized Educational Video(s) Outcome: Progressing   Problem: Activity: Goal: Ability to return to baseline activity level will improve Outcome: Progressing   Problem: Cardiovascular: Goal: Ability to achieve and maintain adequate cardiovascular perfusion will improve Outcome: Progressing Goal: Vascular access site(s) Level 0-1 will be maintained Outcome: Progressing   Problem: Health Behavior/Discharge Planning: Goal: Ability to safely manage health-related needs after discharge will improve Outcome: Progressing

## 2023-02-16 NOTE — ED Notes (Signed)
Patient resting quietly in stretcher, respirations even, unlabored, no acute distress noted. Denies needs at this time.  

## 2023-02-16 NOTE — Plan of Care (Signed)

## 2023-02-16 NOTE — Progress Notes (Signed)
ANTICOAGULATION CONSULT NOTE - Follow-up Note  Pharmacy Consult for Heparin Indication: chest pain/ACS  No Known Allergies  Patient Measurements: Height: 5\' 6"  (167.6 cm) Weight: 77.1 kg (169 lb 15.6 oz) IBW/kg (Calculated) : 63.8 Heparin Dosing Weight: 77.1 kg  Vital Signs: Temp: 97.7 F (36.5 C) (07/21 1045) Temp Source: Oral (07/21 1045) BP: 124/82 (07/21 1210) Pulse Rate: 56 (07/21 1210)  Labs: Recent Labs    02/15/23 2112 02/15/23 2118 02/15/23 2119 02/15/23 2312 02/16/23 0833 02/16/23 1441 02/16/23 1502  HGB  --   --  14.0  --   --  14.2  --   HCT  --   --  41.4  --   --  43.5  --   PLT  --   --  173  --   --  136*  --   HEPARINUNFRC  --   --   --   --  0.52  --  0.50  CREATININE  --  0.84  --   --   --  0.92  --   TROPONINIHS 5  --   --  6  --   --   --     Estimated Creatinine Clearance: 85.5 mL/min (by C-G formula based on SCr of 0.92 mg/dL).   Medical History: Past Medical History:  Diagnosis Date   Hyperlipidemia    Hypertension     Medications:  Medications Prior to Admission  Medication Sig Dispense Refill Last Dose   aspirin EC 81 MG tablet Take 81 mg by mouth daily.   02/15/2023   carvedilol (COREG) 25 MG tablet Take 12.5 mg by mouth 2 (two) times daily.   02/15/2023   cetirizine (ZYRTEC) 10 MG chewable tablet Chew 10 mg by mouth daily.   02/15/2023   hydrochlorothiazide (HYDRODIURIL) 25 MG tablet Take 25 mg by mouth daily.   02/15/2023   losartan (COZAAR) 100 MG tablet Take 100 mg by mouth daily.   02/15/2023   OVER THE COUNTER MEDICATION Take 1 capsule by mouth daily. Seamoss   02/15/2023   rosuvastatin (CRESTOR) 40 MG tablet Take 40 mg by mouth daily.   02/15/2023   valACYclovir (VALTREX) 500 MG tablet Take 500 mg by mouth 2 (two) times daily as needed (flares).   UNK   Scheduled:   [START ON 02/17/2023] aspirin EC  81 mg Oral Daily   carvedilol  12.5 mg Oral BID WC   losartan  100 mg Oral Daily   rosuvastatin  40 mg Oral Daily   Infusions:    heparin 900 Units/hr (02/16/23 0040)   nitroGLYCERIN 15 mcg/min (02/16/23 0930)   PRN: acetaminophen, nitroGLYCERIN, ondansetron (ZOFRAN) IV  Assessment: 57 yom with a history of HTN, CAD (small right PDA by [facility] in 2022). Patient is presenting with chest pain. Heparin per pharmacy consult placed for chest pain/ACS.  Patient was not on anticoagulation prior to arrival. Started on 900 units/hr IV heparin following 4000 unit IV heparin bolus. Resulting heparin level is 0.52 which is therapeutic. Confirmatory level is back at 0.5 which is still therapeutic.  No issues with infusion or bleeding per RN.  Hgb 14; plt 173 -- not checked this AM  Goal of Therapy:  Heparin level 0.3-0.7 units/ml Monitor platelets by anticoagulation protocol: Yes   Plan:  Continue heparin infusion at 900 units/hr Daily heparin level Continue to monitor H&H and platelets  Delmar Landau, PharmD, BCPS 02/16/2023 3:43 PM ED Clinical Pharmacist -  [phone]

## 2023-02-16 NOTE — ED Notes (Signed)
[facility] at bedside 

## 2023-02-17 ENCOUNTER — Encounter (HOSPITAL_COMMUNITY)
Admission: EM | Disposition: A | Discharge status: Home / Self Care | Payer: Self-pay | Source: Home / Self Care | Attending: Internal Medicine

## 2023-02-17 ENCOUNTER — Inpatient Hospital Stay (HOSPITAL_COMMUNITY): Payer: Managed Care, Other (non HMO)

## 2023-02-17 ENCOUNTER — Other Ambulatory Visit: Payer: Self-pay

## 2023-02-17 DIAGNOSIS — I25110 Atherosclerotic heart disease of native coronary artery with unstable angina pectoris: Secondary | ICD-10-CM

## 2023-02-17 DIAGNOSIS — R079 Chest pain, unspecified: Secondary | ICD-10-CM

## 2023-02-17 DIAGNOSIS — I10 Essential (primary) hypertension: Secondary | ICD-10-CM | POA: Diagnosis not present

## 2023-02-17 DIAGNOSIS — K2100 Gastro-esophageal reflux disease with esophagitis, without bleeding: Secondary | ICD-10-CM | POA: Diagnosis not present

## 2023-02-17 DIAGNOSIS — I2510 Atherosclerotic heart disease of native coronary artery without angina pectoris: Secondary | ICD-10-CM | POA: Diagnosis not present

## 2023-02-17 DIAGNOSIS — E782 Mixed hyperlipidemia: Secondary | ICD-10-CM

## 2023-02-17 HISTORY — PX: LEFT HEART CATH AND CORONARY ANGIOGRAPHY: CATH118249

## 2023-02-17 LAB — ECHOCARDIOGRAM COMPLETE
AR max vel: 1.85 cm2
AV Peak grad: 9.4 mmHg
Ao pk vel: 1.53 m/s
Area-P 1/2: 3.65 cm2
Height: 66 in
S' Lateral: 2.9 cm
Weight: 2719.59 oz

## 2023-02-17 LAB — BASIC METABOLIC PANEL
Anion gap: 8 (ref 5–15)
BUN: 17 mg/dL (ref 6–20)
CO2: 24 mmol/L (ref 22–32)
Calcium: 8.4 mg/dL — ABNORMAL LOW (ref 8.9–10.3)
Chloride: 105 mmol/L (ref 98–111)
Creatinine, Ser: 1.1 mg/dL (ref 0.61–1.24)
GFR, Estimated: >60 mL/min (ref >60)
Glucose, Bld: 94 mg/dL (ref 70–99)
Potassium: 3.7 mmol/L (ref 3.5–5.1)
Sodium: 137 mmol/L (ref 135–145)

## 2023-02-17 LAB — CBC
HCT: 38.3 % — ABNORMAL LOW (ref 39.0–52.0)
Hemoglobin: 12.7 g/dL — ABNORMAL LOW (ref 13.0–17.0)
MCH: 27.5 pg (ref 26.0–34.0)
MCHC: 33.2 g/dL (ref 30.0–36.0)
MCV: 83.1 fL (ref 80.0–100.0)
Platelets: 133 10*3/uL — ABNORMAL LOW (ref 150–400)
RBC: 4.61 MIL/uL (ref 4.22–5.81)
RDW: 13.1 % (ref 11.5–15.5)
WBC: 5.9 10*3/uL (ref 4.0–10.5)
nRBC: 0 % (ref 0.0–0.2)

## 2023-02-17 LAB — CREATININE, SERUM
Creatinine, Ser: 0.99 mg/dL (ref 0.61–1.24)
GFR, Estimated: >60 mL/min (ref >60)

## 2023-02-17 LAB — LIPID PANEL
Cholesterol: 118 mg/dL (ref 0–200)
HDL: 50 mg/dL (ref >40)
LDL Cholesterol: 58 mg/dL (ref 0–99)
Total CHOL/HDL Ratio: 2.4 RATIO
Triglycerides: 51 mg/dL (ref <150)
VLDL: 10 mg/dL (ref 0–40)

## 2023-02-17 LAB — HEPARIN LEVEL (UNFRACTIONATED)
Heparin Unfractionated: 0.24 IU/mL — ABNORMAL LOW (ref 0.30–0.70)
Heparin Unfractionated: 0.3 IU/mL (ref 0.30–0.70)

## 2023-02-17 SURGERY — LEFT HEART CATH AND CORONARY ANGIOGRAPHY
Anesthesia: LOCAL

## 2023-02-17 MED ORDER — HEPARIN SODIUM (PORCINE) 1000 UNIT/ML IJ SOLN
INTRAMUSCULAR | Status: AC
  Filled 2023-02-17: qty 10

## 2023-02-17 MED ORDER — MIDAZOLAM HCL 2 MG/2ML IJ SOLN
INTRAMUSCULAR | Status: AC
  Filled 2023-02-17: qty 2

## 2023-02-17 MED ORDER — VERAPAMIL HCL 2.5 MG/ML IV SOLN
INTRAVENOUS | Status: AC
  Filled 2023-02-17: qty 2

## 2023-02-17 MED ORDER — MORPHINE SULFATE (PF) 2 MG/ML IV SOLN
1.0000 mg | INTRAVENOUS | Status: DC | PRN
  Administered 2023-02-17 – 2023-02-18 (×3): 1 mg via INTRAVENOUS
  Filled 2023-02-17 (×3): qty 1

## 2023-02-17 MED ORDER — HEPARIN SODIUM (PORCINE) 1000 UNIT/ML IJ SOLN
INTRAMUSCULAR | Status: DC | PRN
  Administered 2023-02-17: 4000 [IU] via INTRAVENOUS

## 2023-02-17 MED ORDER — FENTANYL CITRATE (PF) 100 MCG/2ML IJ SOLN
INTRAMUSCULAR | Status: DC | PRN
  Administered 2023-02-17: 25 ug via INTRAVENOUS

## 2023-02-17 MED ORDER — HEPARIN SODIUM (PORCINE) 5000 UNIT/ML IJ SOLN
5000.0000 [IU] | Freq: Three times a day (TID) | INTRAMUSCULAR | Status: DC
  Administered 2023-02-19: 5000 [IU] via SUBCUTANEOUS
  Filled 2023-02-17 (×3): qty 1

## 2023-02-17 MED ORDER — SODIUM CHLORIDE 0.9 % WEIGHT BASED INFUSION
1.0000 mL/kg/h | INTRAVENOUS | Status: AC
  Administered 2023-02-17: 1 mL/kg/h via INTRAVENOUS

## 2023-02-17 MED ORDER — SODIUM CHLORIDE 0.9% FLUSH: 3.0000 mL | INTRAVENOUS | Status: DC | PRN

## 2023-02-17 MED ORDER — IOHEXOL 350 MG/ML SOLN
INTRAVENOUS | Status: DC | PRN
  Administered 2023-02-17: 72 mL

## 2023-02-17 MED ORDER — MIDAZOLAM HCL 2 MG/2ML IJ SOLN
INTRAMUSCULAR | Status: DC | PRN
  Administered 2023-02-17: 2 mg via INTRAVENOUS

## 2023-02-17 MED ORDER — SODIUM CHLORIDE 0.9 % IV SOLN: 250.0000 mL | INTRAVENOUS | Status: DC | PRN

## 2023-02-17 MED ORDER — HEPARIN (PORCINE) IN NACL 1000-0.9 UT/500ML-% IV SOLN
INTRAVENOUS | Status: DC | PRN
  Administered 2023-02-17 (×2): 500 mL

## 2023-02-17 MED ORDER — SODIUM CHLORIDE 0.9% FLUSH
3.0000 mL | Freq: Two times a day (BID) | INTRAVENOUS | Status: DC
  Administered 2023-02-18: 3 mL via INTRAVENOUS

## 2023-02-17 MED ORDER — RANOLAZINE ER 500 MG PO TB12
1000.0000 mg | ORAL_TABLET | Freq: Two times a day (BID) | ORAL | Status: DC
  Administered 2023-02-17 – 2023-02-19 (×4): 1000 mg via ORAL
  Filled 2023-02-17 (×4): qty 2

## 2023-02-17 MED ORDER — PANTOPRAZOLE SODIUM 40 MG PO TBEC
40.0000 mg | DELAYED_RELEASE_TABLET | Freq: Every day | ORAL | Status: DC
  Administered 2023-02-17 – 2023-02-19 (×3): 40 mg via ORAL
  Filled 2023-02-17 (×3): qty 1

## 2023-02-17 MED ORDER — FENTANYL CITRATE (PF) 100 MCG/2ML IJ SOLN
INTRAMUSCULAR | Status: AC
  Filled 2023-02-17: qty 2

## 2023-02-17 MED ORDER — LIDOCAINE HCL (PF) 1 % IJ SOLN
INTRAMUSCULAR | Status: DC | PRN
  Administered 2023-02-17: 2 mL

## 2023-02-17 MED ORDER — VERAPAMIL HCL 2.5 MG/ML IV SOLN
INTRAVENOUS | Status: DC | PRN
  Administered 2023-02-17: 10 mL via INTRA_ARTERIAL

## 2023-02-17 MED ORDER — RANOLAZINE ER 500 MG PO TB12
500.0000 mg | ORAL_TABLET | Freq: Two times a day (BID) | ORAL | Status: DC
  Administered 2023-02-17: 500 mg via ORAL
  Filled 2023-02-17: qty 1

## 2023-02-17 MED ORDER — LIDOCAINE HCL (PF) 1 % IJ SOLN
INTRAMUSCULAR | Status: AC
  Filled 2023-02-17: qty 30

## 2023-02-17 SURGICAL SUPPLY — 16 items
CATH 5FR JL3.5 JR4 ANG PIG MP (CATHETERS) IMPLANT
CATH INFINITI 5FR JL4 (CATHETERS) IMPLANT
CATH LAUNCHER 5F RADR (CATHETERS) IMPLANT
CATHETER LAUNCHER 5F RADR (CATHETERS) ×1
DEVICE RAD COMP TR BAND LRG (VASCULAR PRODUCTS) IMPLANT
GLIDESHEATH SLEND SS 6F .021 (SHEATH) IMPLANT
GUIDEWIRE INQWIRE 1.5J.035X260 (WIRE) IMPLANT
INQWIRE 1.5J .035X260CM (WIRE) ×2
KIT HEART LEFT (KITS) ×2 IMPLANT
KIT SINGLE USE MANIFOLD (KITS) IMPLANT
PACK CARDIAC CATHETERIZATION (CUSTOM PROCEDURE TRAY) ×2 IMPLANT
PROTECTION STATION PRESSURIZED (MISCELLANEOUS) ×1
SET ATX-X65L (MISCELLANEOUS) IMPLANT
STATION PROTECTION PRESSURIZED (MISCELLANEOUS) IMPLANT
TRANSDUCER W/STOPCOCK (MISCELLANEOUS) ×2 IMPLANT
TUBING CIL FLEX 10 FLL-RA (TUBING) ×2 IMPLANT

## 2023-02-17 NOTE — TOC CM/SW Note (Signed)
Transition of Care [facility]) - Inpatient Brief Assessment   Patient Details  Name: Billy Baker MRN: [id number] Date of Birth: 11/24/1965  Transition of Care [facility] CM/SW Contact:    Harriet Masson, RN Phone Number: 02/17/2023, 12:32 PM   Clinical Narrative:  Patient scheduled for heart cath today.  TOC following.  Transition of Care Asessment: Insurance and Status: Insurance coverage has been reviewed Patient has primary care physician: Yes Home environment has been reviewed: safe to discharge home Prior level of function:: independent Prior/Current Home Services: No current home services Social Determinants of Health Reivew: SDOH reviewed no interventions necessary Readmission risk has been reviewed: Yes Transition of care needs: no transition of care needs at this time

## 2023-02-17 NOTE — Progress Notes (Signed)
Progress Note  Patient Name: Billy Baker Date of Encounter: 02/17/2023  Primary Cardiologist: None   Subjective   Patient seen examined his bedside.  His wife at the bedside when I arrived.  He was awake watching television.  He reports that he still is experiencing 6 out of 10 intermittent chest discomfort despite the heparin and the nitro drip.  No other complaints at this time.  Inpatient Medications    Scheduled Meds:  aspirin EC  81 mg Oral Daily   carvedilol  12.5 mg Oral BID WC   losartan  100 mg Oral Daily   rosuvastatin  40 mg Oral Daily   Continuous Infusions:  sodium chloride 1 mL/kg/hr (02/17/23 0701)   heparin 900 Units/hr (02/17/23 0701)   nitroGLYCERIN 40 mcg/min (02/17/23 0701)   PRN Meds: acetaminophen, nitroGLYCERIN, ondansetron (ZOFRAN) IV   Vital Signs    Vitals:   02/17/23 0500 02/17/23 0600 02/17/23 0625 02/17/23 0800  BP:   118/79   Pulse: (!) 59 62 61   Resp: 17 18 16    Temp:    99 F (37.2 C)  TempSrc:    Oral  SpO2: 94% 96% 96%   Weight:      Height:        Intake/Output Summary (Last 24 hours) at 02/17/2023 0826 Last data filed at 02/17/2023 0701 Gross per 24 hour  Intake 892.14 ml  Output --  Net 892.14 ml   Filed Weights   02/15/23 2110 02/15/23 2333  Weight: 77.1 kg 77.1 kg    Telemetry    Sinus rhythm- Personally Reviewed  ECG    None today- Personally Reviewed  Physical Exam   General: Awake in bed, no acute distress Head: Atraumatic, normal size  Eyes: PEERLA, EOMI  Neck: Supple, normal JVD Cardiac: Normal S1, S2; RRR; no murmurs, rubs, or gallops Lungs: Clear to auscultation bilaterally Abd: Soft, nontender, no hepatomegaly  Ext: warm, no edema Musculoskeletal: No deformities, BUE and BLE strength normal and equal Skin: Warm and dry, no rashes   Neuro: Alert and oriented to person, place, time, and situation, CNII-XII grossly intact, no focal deficits  Psych: Normal mood and affect   Labs     Chemistry Recent Labs  Lab 02/15/23 2118 02/16/23 1441 02/17/23 0023  NA 137 138 137  K 3.4* 3.8 3.7  CL 102 100 105  CO2 25 24 24   GLUCOSE 123* 110* 94  BUN 20 17 17   CREATININE 0.84 0.92 1.10  CALCIUM 8.8* 8.8* 8.4*  GFRNONAA >60 >60 >60  ANIONGAP 10 14 8      Hematology Recent Labs  Lab 02/15/23 2119 02/16/23 1441 02/17/23 0023  WBC 4.8 4.9 5.9  RBC 5.01 5.19 4.61  HGB 14.0 14.2 12.7*  HCT 41.4 43.5 38.3*  MCV 82.6 83.8 83.1  MCH 27.9 27.4 27.5  MCHC 33.8 32.6 33.2  RDW 13.0 13.0 13.1  PLT 173 136* 133*    Cardiac EnzymesNo results for input(s): "TROPONINI" in the last 168 hours. No results for input(s): "TROPIPOC" in the last 168 hours.   BNPNo results for input(s): "BNP", "PROBNP" in the last 168 hours.   DDimer No results for input(s): "DDIMER" in the last 168 hours.   Radiology    DG Chest 2 View  Result Date: 02/15/2023 CLINICAL DATA:  Chest pain EXAM: CHEST - 2 VIEW COMPARISON:  Chest x-ray 08/07/2021 FINDINGS: The heart size and mediastinal contours are within normal limits. Both lungs are clear. The visualized skeletal structures are  unremarkable. IMPRESSION: No active cardiopulmonary disease. Electronically Signed   By: Darliss Cheney M.D.   On: 02/15/2023 21:51    Cardiac Studies   None  Patient Profile     57 y.o. male with history of coronary artery disease that has been medically currently managed in the past, hypertension, hyperlipidemia, OSA.  Assessment & Plan    Coronary artery disease-status post heart catheterization in September 2022 which showed minimal LAD disease, 80% mid to distal circumflex, mild proximal and mid RCA disease and a CTO in the right PDA with collaterals which was reported and advised to be medically managed. Given his recurrent chest pain on heparin drip as well as nitro drip will be beneficial to proceed with heart catheterization in this patient. Informed Consent   Shared Decision Making/Informed Consent The  risks [stroke (1 in 1000), death (1 in 1000), kidney failure [usually temporary] (1 in 500), bleeding (1 in 200), allergic reaction [possibly serious] (1 in 200)], benefits (diagnostic support and management of coronary artery disease) and alternatives of a cardiac catheterization were discussed in detail with [name] and he is willing to proceed.  For now continue with the aspirin 81 mg daily, carvedilol 12.5 mg daily, losartan 100 mg daily, and rosuvastatin 40 mg daily.  He is on heparin drip continue this as well as his nitro drip. All of his questions as well as his wife questions about heart catheterization has been answered during my visit.  Blood pressure is acceptable, continue with current antihypertensive regimen.  Hyperlipidemia - continue with current statin medication.  Please add on hemoglobin A1c to his current testing labs.         For questions or updates, please contact [facility] Please consult [email or web address] for contact info under Cardiology/STEMI.      Signed, Thomasene Ripple, DO  02/17/2023, 8:26 AM

## 2023-02-17 NOTE — Interval H&P Note (Signed)
History and Physical Interval Note:  02/17/2023 9:56 AM  Billy Baker  has presented today for surgery, with the diagnosis of unstable angina.  The various methods of treatment have been discussed with the patient and family. After consideration of risks, benefits and other options for treatment, the patient has consented to  Procedure(s): LEFT HEART CATH AND CORONARY ANGIOGRAPHY (N/A) as a surgical intervention.  The patient's history has been reviewed, patient examined, no change in status, stable for surgery.  I have reviewed the patient's chart and labs.  Questions were answered to the patient's satisfaction.   Cath Lab Visit (complete for each Cath Lab visit)  Clinical Evaluation Leading to the Procedure:   ACS: Yes.    Non-ACS:    Anginal Classification: CCS IV  Anti-ischemic medical therapy: Maximal Therapy (2 or more classes of medications)  Non-Invasive Test Results: No non-invasive testing performed  Prior CABG: No previous CABG        Theron Arista [facility] 02/17/2023 9:56 AM

## 2023-02-17 NOTE — Progress Notes (Signed)
Echocardiogram 2D Echocardiogram has been performed.  Billy Baker 02/17/2023, 1:52 PM

## 2023-02-17 NOTE — Plan of Care (Signed)

## 2023-02-17 NOTE — Progress Notes (Signed)
ANTICOAGULATION CONSULT NOTE - Follow-up Note  Pharmacy Consult for Heparin Indication: chest pain/ACS  No Known Allergies  Patient Measurements: Height: 5\' 6"  (167.6 cm) Weight: 77.1 kg (169 lb 15.6 oz) IBW/kg (Calculated) : 63.8 Heparin Dosing Weight: 77.1 kg  Vital Signs: Temp: 98.4 F (36.9 C) (07/21 2300) Temp Source: Oral (07/21 2300) BP: 106/75 (07/22 0200) Pulse Rate: 63 (07/22 0200)  Labs: Recent Labs    02/15/23 2112 02/15/23 2118 02/15/23 2119 02/15/23 2312 02/16/23 0833 02/16/23 1441 02/16/23 1502 02/16/23 1830 02/16/23 1953 02/17/23 0023  HGB   < >  --  14.0  --   --  14.2  --   --   --  12.7*  HCT  --   --  41.4  --   --  43.5  --   --   --  38.3*  PLT  --   --  173  --   --  136*  --   --   --  133*  HEPARINUNFRC  --   --   --   --  0.52  --  0.50  --   --  0.24*  CREATININE  --  0.84  --   --   --  0.92  --   --   --  1.10  TROPONINIHS  --   --   --  6  --   --   --  19* 19*  --    < > = values in this interval not displayed.    Estimated Creatinine Clearance: 71.5 mL/min (by C-G formula based on SCr of 1.1 mg/dL).   Medical History: Past Medical History:  Diagnosis Date   Hyperlipidemia    Hypertension     Medications:  Medications Prior to Admission  Medication Sig Dispense Refill Last Dose   aspirin EC 81 MG tablet Take 81 mg by mouth daily.   02/15/2023   carvedilol (COREG) 25 MG tablet Take 12.5 mg by mouth 2 (two) times daily.   02/15/2023   cetirizine (ZYRTEC) 10 MG chewable tablet Chew 10 mg by mouth daily.   02/15/2023   hydrochlorothiazide (HYDRODIURIL) 25 MG tablet Take 25 mg by mouth daily.   02/15/2023   losartan (COZAAR) 100 MG tablet Take 100 mg by mouth daily.   02/15/2023   OVER THE COUNTER MEDICATION Take 1 capsule by mouth daily. Seamoss   02/15/2023   rosuvastatin (CRESTOR) 40 MG tablet Take 40 mg by mouth daily.   02/15/2023   valACYclovir (VALTREX) 500 MG tablet Take 500 mg by mouth 2 (two) times daily as needed (flares).    UNK   Scheduled:   aspirin  81 mg Oral Pre-Cath   aspirin EC  81 mg Oral Daily   carvedilol  12.5 mg Oral BID WC   losartan  100 mg Oral Daily   rosuvastatin  40 mg Oral Daily   Infusions:   sodium chloride     Followed by   sodium chloride     heparin 900 Units/hr (02/17/23 0200)   nitroGLYCERIN 40 mcg/min (02/17/23 0200)   PRN: acetaminophen, nitroGLYCERIN, ondansetron (ZOFRAN) IV  Assessment: 57 yom with a history of HTN, CAD (small right PDA by [facility] in 2022). Patient is presenting with chest pain. Heparin per pharmacy consult placed for chest pain/ACS.  Patient was not on anticoagulation prior to arrival. Started on 900 units/hr IV heparin following 4000 unit IV heparin bolus. Resulting heparin level is 0.52 which is therapeutic. Confirmatory level is  back at 0.5 which is still therapeutic.  No issues with infusion or bleeding per RN.  Hgb 14; plt 173 -- not checked this AM  7/22AM: Heparin level returned at 0.24. Per RN, IV line was leaking and not getting into the patient and was moved to another IV, but unsure how long it was leaking for . CBC stable, Hgb 14.2>12.7. Since patient has been stable on current regimen- will continue same rate and recheck level in 8 hours  Goal of Therapy:  Heparin level 0.3-0.7 units/ml Monitor platelets by anticoagulation protocol: Yes   Plan:  Continue heparin infusion at 900 units/hr 8h heparin level Continue to monitor H&H and platelets  Arabella Merles, PharmD. Clinical Pharmacist 02/17/2023 2:10 AM

## 2023-02-17 NOTE — Progress Notes (Signed)
Paged by nurse regarding intermittent chest discomfort.  Patient underwent cardiac catheterization earlier today that showed two-vessel CAD with 90% stenosis in small distal left circumflex artery, chronically occluded small PDA, normal LVEDP and a normal EF.  Medical therapy was recommended.  Serial troponin 19--> 19.  Talking with the patient, he complains of burning sensation radiating out from the central chest.  This has been going on for the past 3 days.  The chest discomfort does not move down the abdomen.  It does radiate down the left arm.  Echocardiogram was normal with EF 65 to 70%.  He is on carvedilol, IV nitroglycerin and Ranexa.  I will increase Ranexa dosage to 1000 mg twice a day.  I also gave the patient some as needed dose of morphine.  His radial pulse and lower extremity pulses are equal bilaterally.  His blood pressure although mildly elevated in the 150s now, however was previously normal.  Suspicion for PE or aortic dissection fairly low and I would prefer not to give the patient another contrast dye load after the cardiac catheterization this morning.  Patient did have a episode of nausea and vomiting. Given zofran by nurse.  EKG reviewed, no acute change.  Will continue to monitor.  If symptom worsens, patient has been instructed to let the nurse know, may consider CT angio of aorta to rule out dissection, again probability is fairly low.

## 2023-02-17 NOTE — H&P (View-Only) (Signed)
Progress Note  Patient Name: Billy Baker Date of Encounter: 02/17/2023  Primary Cardiologist: None   Subjective   Patient seen examined his bedside.  His wife at the bedside when I arrived.  He was awake watching television.  He reports that he still is experiencing 6 out of 10 intermittent chest discomfort despite the heparin and the nitro drip.  No other complaints at this time.  Inpatient Medications    Scheduled Meds:  aspirin EC  81 mg Oral Daily   carvedilol  12.5 mg Oral BID WC   losartan  100 mg Oral Daily   rosuvastatin  40 mg Oral Daily   Continuous Infusions:  sodium chloride 1 mL/kg/hr (02/17/23 0701)   heparin 900 Units/hr (02/17/23 0701)   nitroGLYCERIN 40 mcg/min (02/17/23 0701)   PRN Meds: acetaminophen, nitroGLYCERIN, ondansetron (ZOFRAN) IV   Vital Signs    Vitals:   02/17/23 0500 02/17/23 0600 02/17/23 0625 02/17/23 0800  BP:   118/79   Pulse: (!) 59 62 61   Resp: 17 18 16    Temp:    99 F (37.2 C)  TempSrc:    Oral  SpO2: 94% 96% 96%   Weight:      Height:        Intake/Output Summary (Last 24 hours) at 02/17/2023 0826 Last data filed at 02/17/2023 0701 Gross per 24 hour  Intake 892.14 ml  Output --  Net 892.14 ml   Filed Weights   02/15/23 2110 02/15/23 2333  Weight: 77.1 kg 77.1 kg    Telemetry    Sinus rhythm- Personally Reviewed  ECG    None today- Personally Reviewed  Physical Exam   General: Awake in bed, no acute distress Head: Atraumatic, normal size  Eyes: PEERLA, EOMI  Neck: Supple, normal JVD Cardiac: Normal S1, S2; RRR; no murmurs, rubs, or gallops Lungs: Clear to auscultation bilaterally Abd: Soft, nontender, no hepatomegaly  Ext: warm, no edema Musculoskeletal: No deformities, BUE and BLE strength normal and equal Skin: Warm and dry, no rashes   Neuro: Alert and oriented to person, place, time, and situation, CNII-XII grossly intact, no focal deficits  Psych: Normal mood and affect   Labs     Chemistry Recent Labs  Lab 02/15/23 2118 02/16/23 1441 02/17/23 0023  NA 137 138 137  K 3.4* 3.8 3.7  CL 102 100 105  CO2 25 24 24   GLUCOSE 123* 110* 94  BUN 20 17 17   CREATININE 0.84 0.92 1.10  CALCIUM 8.8* 8.8* 8.4*  GFRNONAA >60 >60 >60  ANIONGAP 10 14 8      Hematology Recent Labs  Lab 02/15/23 2119 02/16/23 1441 02/17/23 0023  WBC 4.8 4.9 5.9  RBC 5.01 5.19 4.61  HGB 14.0 14.2 12.7*  HCT 41.4 43.5 38.3*  MCV 82.6 83.8 83.1  MCH 27.9 27.4 27.5  MCHC 33.8 32.6 33.2  RDW 13.0 13.0 13.1  PLT 173 136* 133*    Cardiac EnzymesNo results for input(s): "TROPONINI" in the last 168 hours. No results for input(s): "TROPIPOC" in the last 168 hours.   BNPNo results for input(s): "BNP", "PROBNP" in the last 168 hours.   DDimer No results for input(s): "DDIMER" in the last 168 hours.   Radiology    DG Chest 2 View  Result Date: 02/15/2023 CLINICAL DATA:  Chest pain EXAM: CHEST - 2 VIEW COMPARISON:  Chest x-ray 08/07/2021 FINDINGS: The heart size and mediastinal contours are within normal limits. Both lungs are clear. The visualized skeletal structures are  unremarkable. IMPRESSION: No active cardiopulmonary disease. Electronically Signed   By: Darliss Cheney M.D.   On: 02/15/2023 21:51    Cardiac Studies   None  Patient Profile     57 y.o. male with history of coronary artery disease that has been medically currently managed in the past, hypertension, hyperlipidemia, OSA.  Assessment & Plan    Coronary artery disease-status post heart catheterization in September 2022 which showed minimal LAD disease, 80% mid to distal circumflex, mild proximal and mid RCA disease and a CTO in the right PDA with collaterals which was reported and advised to be medically managed. Given his recurrent chest pain on heparin drip as well as nitro drip will be beneficial to proceed with heart catheterization in this patient. Informed Consent   Shared Decision Making/Informed Consent The  risks [stroke (1 in 1000), death (1 in 1000), kidney failure [usually temporary] (1 in 500), bleeding (1 in 200), allergic reaction [possibly serious] (1 in 200)], benefits (diagnostic support and management of coronary artery disease) and alternatives of a cardiac catheterization were discussed in detail with [name] and he is willing to proceed.  For now continue with the aspirin 81 mg daily, carvedilol 12.5 mg daily, losartan 100 mg daily, and rosuvastatin 40 mg daily.  He is on heparin drip continue this as well as his nitro drip. All of his questions as well as his wife questions about heart catheterization has been answered during my visit.  Blood pressure is acceptable, continue with current antihypertensive regimen.  Hyperlipidemia - continue with current statin medication.  Please add on hemoglobin A1c to his current testing labs.         For questions or updates, please contact [facility] Please consult [email or web address] for contact info under Cardiology/STEMI.      Signed, Billy Ripple, DO  02/17/2023, 8:26 AM

## 2023-02-17 NOTE — Progress Notes (Signed)
[facility]                                                                              Billy Baker, is a 57 y.o. male, DOB - 01/20/1966, QMV:[id number] Admit date - 02/15/2023    Outpatient Primary MD for the patient is [facility], [facility]  LOS - 1  days  No chief complaint on file.      Brief summary   Patient is a 57 year old male with HTN, HLP, CAD, GERD, CKD, ulcerative colitis, PTSD, anxiety/depression, OSA presented with chest pain.  Per patient, was walking up the stairs a day before the admission when he had onset of chest pain, described as central chest pressure, radiating to left arm, associated with shortness of breath, left arm tingling and's mild lightheadedness.  Symptoms did improve with rest but returned later.  Pain similar to to when he had previous cath in 2022 which showed a chronic occlusion and otherwise nonobstructive CAD. Patient admitted for further workup, cardiology consulted  Assessment & Plan    Principal Problem:   Chest pain, rule out acute myocardial infarction History of CAD  -In the setting of known CAD, followed by cardiology at the [facility].  Had cardiac cath in 03/2021 showing normal LM, minimal LAD disease, 80% mid to distal LCx, mild prox and mid RCA disease and CTO of RDPA with collaterals present and medical management. -Troponins negative but EKG with new T wave inversion along the inferior and lateral leads. -Cardiology consulted, placed on IV heparin drip, IV nitro, aspirin, Coreg, Crestor -Overnight had worsening of the chest pain 9/10 with EKG changes, awaiting cardiac cath today.  Chest pain currently improving. -2D echo pending   Active Problems:   HTN (hypertension) -BP currently stable, continue losartan, Coreg -Hold HCTZ    HLD (hyperlipidemia) -Continue rosuvastatin    GERD (gastroesophageal reflux disease) -Place on Protonix     Obstructive sleep apnea syndrome -History of minimal adherence  with CPAP    Ulcerative colitis (HCC) -Reported history of ulcerative colitis, currently not on any immunosuppressants or other medications.  No GI symptoms    Anxiety -Currently stable  Estimated body mass index is 27.43 kg/m as calculated from the following:   Height as of this encounter: 5\' 6"  (1.676 m).   Weight as of this encounter: 77.1 kg.  Code Status: Full code DVT Prophylaxis:  On heparin drip   Level of Care: Level of care: Telemetry Cardiac Family Communication: Updated patient's wife at the bedside Disposition Plan:      Remains inpatient appropriate: Workup in progress   Procedures:    Consultants:   Cardiology  Antimicrobials: None    Medications  aspirin EC  81 mg Oral Daily   carvedilol  12.5 mg Oral BID WC   losartan  100 mg Oral Daily   rosuvastatin  40 mg Oral Daily      Subjective:   Billy Baker was seen and examined today.  States overnight had significant chest pain, 9/10, currently on nitro drip, IV heparin drip.  Chest pain improving.  No acute shortness of breath, palpitations, nausea or vomiting.  Objective:   Vitals:   02/17/23 0500 02/17/23 0600 02/17/23 0625 02/17/23 0800  BP:   118/79   Pulse: (!) 59 62 61   Resp: 17 18 16    Temp:    99 F (37.2 C)  TempSrc:    Oral  SpO2: 94% 96% 96%   Weight:      Height:        Intake/Output Summary (Last 24 hours) at 02/17/2023 0901 Last data filed at 02/17/2023 0701 Gross per 24 hour  Intake 892.14 ml  Output --  Net 892.14 ml     Wt Readings from Last 3 Encounters:  02/15/23 77.1 kg  02/10/20 81.6 kg  06/12/16 83 kg     Exam General: Alert and oriented x 3, NAD Cardiovascular: S1 S2 auscultated,  RRR Respiratory: Clear to auscultation bilaterally, no wheezing Gastrointestinal: Soft, nontender, nondistended, + bowel sounds Ext: no pedal edema bilaterally Neuro: no new deficits Psych: Normal affect     Data Reviewed:  I have personally reviewed following labs     CBC Lab Results  Component Value Date   WBC 5.9 02/17/2023   RBC 4.61 02/17/2023   HGB 12.7 (L) 02/17/2023   HCT 38.3 (L) 02/17/2023   MCV 83.1 02/17/2023   MCH 27.5 02/17/2023   PLT 133 (L) 02/17/2023   MCHC 33.2 02/17/2023   RDW 13.1 02/17/2023   LYMPHSABS 1.9 02/10/2020   MONOABS 0.6 02/10/2020   EOSABS 0.1 02/10/2020   BASOSABS 0.1 02/10/2020     Last metabolic panel Lab Results  Component Value Date   NA 137 02/17/2023   K 3.7 02/17/2023   CL 105 02/17/2023   CO2 24 02/17/2023   BUN 17 02/17/2023   CREATININE 1.10 02/17/2023   GLUCOSE 94 02/17/2023   GFRNONAA >60 02/17/2023   GFRAA >60 02/10/2020   CALCIUM 8.4 (L) 02/17/2023   PROT 6.6 04/16/2016   ALBUMIN 4.2 04/16/2016   BILITOT 0.7 04/16/2016   ALKPHOS 55 04/16/2016   AST 14 04/16/2016   ALT 13 04/16/2016   ANIONGAP 8 02/17/2023    CBG (last 3)  No results for input(s): "GLUCAP" in the last 72 hours.    Coagulation Profile: No results for input(s): "INR", "PROTIME" in the last 168 hours.   Radiology Studies: I have personally reviewed the imaging studies  DG Chest 2 View  Result Date: 02/15/2023 CLINICAL DATA:  Chest pain EXAM: CHEST - 2 VIEW COMPARISON:  Chest x-ray 08/07/2021 FINDINGS: The heart size and mediastinal contours are within normal limits. Both lungs are clear. The visualized skeletal structures are unremarkable. IMPRESSION: No active cardiopulmonary disease. Electronically Signed   By: Darliss Cheney M.D.   On: 02/15/2023 21:51       Valdis Bevill M.D. [facility] 02/17/2023, 9:01 AM  Available via Epic secure chat 7am-7pm After 7 pm, please refer to night coverage provider listed on amion.

## 2023-02-18 ENCOUNTER — Encounter (HOSPITAL_COMMUNITY): Payer: Self-pay | Admitting: Cardiology

## 2023-02-18 DIAGNOSIS — R079 Chest pain, unspecified: Secondary | ICD-10-CM | POA: Diagnosis not present

## 2023-02-18 DIAGNOSIS — I2510 Atherosclerotic heart disease of native coronary artery without angina pectoris: Secondary | ICD-10-CM | POA: Diagnosis not present

## 2023-02-18 DIAGNOSIS — K2100 Gastro-esophageal reflux disease with esophagitis, without bleeding: Secondary | ICD-10-CM | POA: Diagnosis not present

## 2023-02-18 DIAGNOSIS — I10 Essential (primary) hypertension: Secondary | ICD-10-CM | POA: Diagnosis not present

## 2023-02-18 LAB — CBC
HCT: 36.4 % — ABNORMAL LOW (ref 39.0–52.0)
Hemoglobin: 12.3 g/dL — ABNORMAL LOW (ref 13.0–17.0)
MCH: 28.3 pg (ref 26.0–34.0)
MCHC: 33.8 g/dL (ref 30.0–36.0)
MCV: 83.9 fL (ref 80.0–100.0)
Platelets: 133 10*3/uL — ABNORMAL LOW (ref 150–400)
RBC: 4.34 MIL/uL (ref 4.22–5.81)
RDW: 13.2 % (ref 11.5–15.5)
WBC: 6.7 10*3/uL (ref 4.0–10.5)
nRBC: 0 % (ref 0.0–0.2)

## 2023-02-18 LAB — HEPATIC FUNCTION PANEL
ALT: 23 U/L (ref 0–44)
AST: 25 U/L (ref 15–41)
Albumin: 3.3 g/dL — ABNORMAL LOW (ref 3.5–5.0)
Alkaline Phosphatase: 45 U/L (ref 38–126)
Bilirubin, Direct: <0.1 mg/dL (ref 0.0–0.2)
Total Bilirubin: 0.6 mg/dL (ref 0.3–1.2)
Total Protein: 6.3 g/dL — ABNORMAL LOW (ref 6.5–8.1)

## 2023-02-18 LAB — RESPIRATORY PANEL BY PCR

## 2023-02-18 LAB — HEMOGLOBIN A1C
Hgb A1c MFr Bld: 5.8 % — ABNORMAL HIGH (ref 4.8–5.6)
Mean Plasma Glucose: 119.76 mg/dL

## 2023-02-18 LAB — PROCALCITONIN: Procalcitonin: <0.1 ng/mL

## 2023-02-18 MED ORDER — NITROGLYCERIN 0.4 MG/HR TD PT24
0.4000 mg | MEDICATED_PATCH | Freq: Every day | TRANSDERMAL | Status: DC
  Administered 2023-02-18: 0.4 mg via TRANSDERMAL
  Filled 2023-02-18 (×2): qty 1

## 2023-02-18 MED ORDER — CLOPIDOGREL BISULFATE 75 MG PO TABS
300.0000 mg | ORAL_TABLET | Freq: Once | ORAL | Status: AC
  Administered 2023-02-18: 300 mg via ORAL
  Filled 2023-02-18: qty 4

## 2023-02-18 MED ORDER — CLOPIDOGREL BISULFATE 75 MG PO TABS
75.0000 mg | ORAL_TABLET | Freq: Every day | ORAL | Status: DC
  Administered 2023-02-19: 75 mg via ORAL
  Filled 2023-02-18: qty 1

## 2023-02-18 NOTE — Progress Notes (Signed)
Mobility Specialist Progress Note:   02/18/23 1122  Mobility  Activity Ambulated independently in hallway  Level of Assistance Standby assist, set-up cues, supervision of patient - no hands on  Assistive Device None  Distance Ambulated (ft) 340 ft  Activity Response Tolerated well  Mobility Referral Yes  $Mobility charge 1 Mobility  Mobility Specialist Start Time (ACUTE ONLY) 1111  Mobility Specialist Stop Time (ACUTE ONLY) 1122  Mobility Specialist Time Calculation (min) (ACUTE ONLY) 11 min    During Mobility: 72 HR  Post Mobility: 63 HR , 96% SpO2 RA   Pt received in bed, agreeable to mobility. Denied any chest pain or lightheadedness during ambulation, asymptomatic throughout. Pt returned to bed with call bell in hand and all needs met.   Billy Baker  Mobility Specialist Please contact via [employer] office at [phone]

## 2023-02-18 NOTE — Progress Notes (Addendum)
Progress Note  Patient Name: Billy Baker Date of Encounter: 02/18/2023  Primary Cardiologist: None   Subjective   Patient seen examined his bedside.  His wife at the bedside when I arrived.  Post LHC yesterday - event overnight noted  Inpatient Medications    Scheduled Meds:  aspirin EC  81 mg Oral Daily   carvedilol  12.5 mg Oral BID WC   clopidogrel  300 mg Oral Once   [START ON 02/19/2023] clopidogrel  75 mg Oral Daily   heparin  5,000 Units Subcutaneous Q8H   losartan  100 mg Oral Daily   nitroGLYCERIN  0.4 mg Transdermal Daily   pantoprazole  40 mg Oral Q0600   ranolazine  1,000 mg Oral BID   rosuvastatin  40 mg Oral Daily   sodium chloride flush  3 mL Intravenous Q12H   Continuous Infusions:  sodium chloride     PRN Meds: sodium chloride, acetaminophen, morphine injection, nitroGLYCERIN, ondansetron (ZOFRAN) IV, sodium chloride flush   Vital Signs    Vitals:   02/17/23 1700 02/17/23 1948 02/17/23 2320 02/18/23 0805  BP: (!) 102/59 133/82 (!) 140/80 114/75  Pulse: 76 62 65 65  Resp: 20 20 16 16   Temp:  (!) 100.4 F (38 C) 98.7 F (37.1 C) 98.3 F (36.8 C)  TempSrc:  Oral Oral Oral  SpO2: 96% 95% 95% 97%  Weight:      Height:        Intake/Output Summary (Last 24 hours) at 02/18/2023 0931 Last data filed at 02/18/2023 0300 Gross per 24 hour  Intake 989.88 ml  Output 1925 ml  Net -935.12 ml   Filed Weights   02/15/23 2110 02/15/23 2333  Weight: 77.1 kg 77.1 kg    Telemetry    Sinus rhythm- Personally Reviewed  ECG    None today- Personally Reviewed  Physical Exam   General: Awake in bed, no acute distress Head: Atraumatic, normal size  Eyes: PEERLA, EOMI  Neck: Supple, normal JVD Cardiac: Normal S1, S2; RRR; no murmurs, rubs, or gallops Lungs: Clear to auscultation bilaterally Abd: Soft, nontender, no hepatomegaly  Ext: warm, no edema Musculoskeletal: No deformities, BUE and BLE strength normal and equal Skin: Warm and dry, no rashes    Neuro: Alert and oriented to person, place, time, and situation, CNII-XII grossly intact, no focal deficits  Psych: Normal mood and affect   Labs    Chemistry Recent Labs  Lab 02/15/23 2118 02/16/23 1441 02/17/23 0023 02/17/23 1223  NA 137 138 137  --   K 3.4* 3.8 3.7  --   CL 102 100 105  --   CO2 25 24 24   --   GLUCOSE 123* 110* 94  --   BUN 20 17 17   --   CREATININE 0.84 0.92 1.10 0.99  CALCIUM 8.8* 8.8* 8.4*  --   GFRNONAA >60 >60 >60 >60  ANIONGAP 10 14 8   --      Hematology Recent Labs  Lab 02/16/23 1441 02/17/23 0023 02/18/23 0245  WBC 4.9 5.9 6.7  RBC 5.19 4.61 4.34  HGB 14.2 12.7* 12.3*  HCT 43.5 38.3* 36.4*  MCV 83.8 83.1 83.9  MCH 27.4 27.5 28.3  MCHC 32.6 33.2 33.8  RDW 13.0 13.1 13.2  PLT 136* 133* 133*    Cardiac EnzymesNo results for input(s): "TROPONINI" in the last 168 hours. No results for input(s): "TROPIPOC" in the last 168 hours.   BNPNo results for input(s): "BNP", "PROBNP" in the last 168 hours.  DDimer No results for input(s): "DDIMER" in the last 168 hours.   Radiology    ECHOCARDIOGRAM COMPLETE  Result Date: 02/17/2023    ECHOCARDIOGRAM REPORT   Patient Name:   Billy Baker Date of Exam: 02/17/2023 Medical Rec #:  [id number]   Height:       66.0 in Accession #:    [id number]  Weight:       170.0 lb Date of Birth:  04-Jun-1966   BSA:          1.866 m Patient Age:    57 years    BP:           122/79 mmHg Patient Gender: M           HR:           63 bpm. Exam Location:  Inpatient Procedure: 2D Echo, Cardiac Doppler and Color Doppler Indications:     Chest Pain  History:         Patient has no prior history of Echocardiogram examinations.                  CAD, Signs/Symptoms:Chest Pain; Risk Factors:Dyslipidemia,                  Hypertension and Sleep Apnea.  Sonographer:     Raeford Razor Referring Phys:  [id number] Cecille Po MELVIN Diagnosing Phys: Epifanio Lesches MD IMPRESSIONS  1. Left ventricular ejection fraction, by estimation, is 65 to  70%. The left ventricle has normal function. The left ventricle has no regional wall motion abnormalities. There is mild left ventricular hypertrophy. Left ventricular diastolic parameters were normal.  2. Right ventricular systolic function is normal. The right ventricular size is normal. Tricuspid regurgitation signal is inadequate for assessing PA pressure.  3. The mitral valve is normal in structure. Trivial mitral valve regurgitation. No evidence of mitral stenosis.  4. The aortic valve is tricuspid. Aortic valve regurgitation is mild. No aortic stenosis is present.  5. Aortic dilatation noted. There is mild dilatation of the ascending aorta, measuring 39 mm.  6. The inferior vena cava is normal in size with greater than 50% respiratory variability, suggesting right atrial pressure of 3 mmHg. FINDINGS  Left Ventricle: Left ventricular ejection fraction, by estimation, is 65 to 70%. The left ventricle has normal function. The left ventricle has no regional wall motion abnormalities. The left ventricular internal cavity size was normal in size. There is  mild left ventricular hypertrophy. Left ventricular diastolic parameters were normal. Right Ventricle: The right ventricular size is normal. No increase in right ventricular wall thickness. Right ventricular systolic function is normal. Tricuspid regurgitation signal is inadequate for assessing PA pressure. Left Atrium: Left atrial size was normal in size. Right Atrium: Right atrial size was normal in size. Pericardium: There is no evidence of pericardial effusion. Mitral Valve: The mitral valve is normal in structure. Trivial mitral valve regurgitation. No evidence of mitral valve stenosis. Tricuspid Valve: The tricuspid valve is normal in structure. Tricuspid valve regurgitation is trivial. Aortic Valve: The aortic valve is tricuspid. Aortic valve regurgitation is mild. No aortic stenosis is present. Aortic valve peak gradient measures 9.4 mmHg. Pulmonic Valve:  The pulmonic valve was not well visualized. Pulmonic valve regurgitation is trivial. Aorta: The aortic root is normal in size and structure and aortic dilatation noted. There is mild dilatation of the ascending aorta, measuring 39 mm. Venous: The inferior vena cava is normal in size with greater than 50% respiratory variability, suggesting right  atrial pressure of 3 mmHg. IAS/Shunts: The interatrial septum was not well visualized.  LEFT VENTRICLE PLAX 2D LVIDd:         4.50 cm   Diastology LVIDs:         2.90 cm   LV e' medial:    8.59 cm/s LV PW:         1.10 cm   LV E/e' medial:  11.2 LV IVS:        1.10 cm   LV e' lateral:   12.30 cm/s LVOT diam:     1.90 cm   LV E/e' lateral: 7.8 LV SV:         58 LV SV Index:   31 LVOT Area:     2.84 cm  RIGHT VENTRICLE             IVC RV Basal diam:  2.90 cm     IVC diam: 1.30 cm RV S prime:     12.70 cm/s TAPSE (M-mode): 2.3 cm LEFT ATRIUM             Index        RIGHT ATRIUM           Index LA diam:        3.80 cm 2.04 cm/m   RA Area:     13.90 cm LA Vol (A2C):   44.6 ml 23.90 ml/m  RA Volume:   34.90 ml  18.70 ml/m LA Vol (A4C):   42.0 ml 22.51 ml/m LA Biplane Vol: 44.1 ml 23.63 ml/m  AORTIC VALVE AV Area (Vmax): 1.85 cm AV Vmax:        153.00 cm/s AV Peak Grad:   9.4 mmHg LVOT Vmax:      100.00 cm/s LVOT Vmean:     64.500 cm/s LVOT VTI:       0.203 m  AORTA Ao Root diam: 2.90 cm Ao Asc diam:  3.90 cm MITRAL VALVE MV Area (PHT): 3.65 cm    SHUNTS MV Decel Time: 208 msec    Systemic VTI:  0.20 m MV E velocity: 96.20 cm/s  Systemic Diam: 1.90 cm MV A velocity: 89.10 cm/s MV E/A ratio:  1.08 Epifanio Lesches MD Electronically signed by Epifanio Lesches MD Signature Date/Time: 02/17/2023/4:24:13 PM    Final (Updated)    CARDIAC CATHETERIZATION  Result Date: 02/17/2023 2 vessel occlusive CAD. There is a 90% segmental stenosis in a small distal LCx vessel. This vessel is only 1.5-1.75 mm in diameter. The right PDA is small and chronically occluded. Normal LV  function Normal LVEDP Plan: recommend medical therapy.    Cardiac Studies   None  Patient Profile     57 y.o. male with history of coronary artery disease that has been medically currently managed in the past, hypertension, hyperlipidemia, OSA.  Assessment & Plan    Coronary artery disease-status post left heart catheterization yesterday, no indication for PCI he does have a very small circumflex vessel with mid to distal stenosis 90%.  Not amenable to PCI given the vessel size.  Medical management has been advised which I think is reasonable at this time.  I explained this to the patient and his wife.  His Ranexa has been increased to 1000 mg twice daily.  I would like to transition the patient off of nitro drip.  Will place a nitro patch and transition him to Imdur.  His blood pressure is also limiting for optimizing and adding all other antianginals like low-dose amlodipine. Will  add Plavix to his antiplatelet therapy. He will be on dual antiplatelet therapy with optimize antianginal in Lipitor.  I did explain to the patient his wife as well he may need cardiac rehab he follows with the [place].  His episode yesterday of chest pain especially after he ate is highly suspicious for GERD.  He is currently not on a PPI I would recommend starting the patient on a PPI will start Protonix  Blood pressure is acceptable, continue with current antihypertensive regimen.  Hyperlipidemia - continue with current statin medication.  Please add on hemoglobin A1c to his current testing labs.         For questions or updates, please contact [facility] Please consult [email or web address] for contact info under Cardiology/STEMI.      Osvaldo Shipper, DO  02/18/2023, 9:31 AM

## 2023-02-18 NOTE — Plan of Care (Signed)

## 2023-02-18 NOTE — Hospital Course (Signed)
Billy Baker is a 57 y.o. male with a history of hypertension, hyperlipidemia, CAD, GERD, CKD, ulcerative colitis, PTSD, depression, anxiety, OSA.  Patient presented secondary to chest pain concerning for ACS.  Cardiology consulted.  Patient started on heparin and nitroglycerin drip.  Cardiology performed a left heart catheterization which is significant for two-vessel disease with recommendation for medical management.  Patient with continued episodes of chest pain and cardiology is adjusting medication to better manage anginal symptoms.

## 2023-02-18 NOTE — Progress Notes (Signed)
PROGRESS NOTE    Billy Baker  ZOX:[id number] DOB: June 03, 1966 DOA: 02/15/2023 PCP: [facility]   Brief Narrative: Billy Baker is a 57 y.o. male with a history of hypertension, hyperlipidemia, CAD, GERD, CKD, ulcerative colitis, PTSD, depression, anxiety, OSA.  Patient presented secondary to chest pain concerning for ACS.  Cardiology consulted.  Patient started on heparin and nitroglycerin drip.  Cardiology performed a left heart catheterization which is significant for two-vessel disease with recommendation for medical management.  Patient with continued episodes of chest pain and cardiology is adjusting medication to better manage anginal symptoms.   Assessment and Plan:  Unstable angina Coronary artery disease Patient with chest pain on admission.  Cardiology consulted patient.  Patient is on heparin drip and heart catheterization was performed on 7/22 which was significant for two-vessel CAD with 90% stenosis and small distal left circumflex artery and chronically occluded small PDA, not amenable to PCI.  Cardiology recommendations for continued medical management for symptoms.  LVEF of 65 to 70%. -Cardiology recommendations: Ranexa, Nitropatch with transition to Imdur, aspirin, Plavix  Fever Unclear etiology. Patient with non-specific symptoms. No likely source. Low concern for bacterial infection. No leukocytosis. Currently without symptoms other than intermittent chest pain, in addition to headache from nitroglycerin drip. -Check RVP, procalcitonin, hepatic function panel, blood cultures -Trend fever curve -Hold off on antibiotics at this time  Chest pain Postprandial episode of chest pain occurred yesterday.  Wife is concerned of possible gallbladder pathology.  Pain radiation does move toward his left jaw and left arm.  Per cardiology, possible GERD.  Pain is improved this morning. -Continue Protonix -Check hepatic function panel  Prediabetes Hemoglobin A1c of 5.8%.   Recommend diet modification and other lifestyle changes.  Patient to follow-up with his primary care physician for ongoing management and recommendations.  Primary hypertension -Continue Coreg and losartan  Hyperlipidemia -Continue Crestor  GERD -Continue Protonix  OSA Per chart, not very adherent with CPAP  Ulcerative colitis Noted. No current symptoms.  Anxiety Noted.  Obesity Estimated body mass index is 27.43 kg/m as calculated from the following:   Height as of this encounter: 5\' 6"  (1.676 m).   Weight as of this encounter: 77.1 kg.  DVT prophylaxis: Subcutaneous heparin Code Status:   Code Status: Full Code Family Communication: Wife at bedside Disposition Plan: Discharge home likely in 1 to 2 days pending continued cardiology recommendations in addition to fever workup   Consultants:  Cardiology  Procedures:  7/22: Transthoracic Echocardiogram 7/23: Left heart catheterization  Antimicrobials: None   Subjective: Patient reports no issues this morning.  He does report that he has been having recurrent chest pain.  Yesterday he reports having substernal chest pain after eating with radiation to his left jaw and left arm.  This pain was nonexertional in nature.  Patient also reports that he had a fever yesterday with associated chills, nausea, vomiting.  No symptoms this morning.  Patient reports no coughing, sneezing, diarrhea.  He does report that during the episode he also had some dyspnea.  No sick contacts.  Objective: BP 114/75 (BP Location: Left Arm)   Pulse 65   Temp 98.3 F (36.8 C) (Oral)   Resp 16   Ht 5\' 6"  (1.676 m)   Wt 77.1 kg   SpO2 97%   BMI 27.43 kg/m   Examination:  General exam: Appears calm and comfortable Respiratory system: Clear to auscultation. Respiratory effort normal. Cardiovascular system: S1 & S2 heard, RRR.  2 out of 6  systolic murmur. Gastrointestinal system: Abdomen is nondistended, soft and nontender. Normal bowel  sounds heard. Central nervous system: Alert and oriented. No focal neurological deficits. Musculoskeletal: No edema. No calf tenderness Skin: No cyanosis. No rashes Psychiatry: Judgement and insight appear normal. Mood & affect appropriate.    Data Reviewed: I have personally reviewed following labs and imaging studies  CBC Lab Results  Component Value Date   WBC 6.7 02/18/2023   RBC 4.34 02/18/2023   HGB 12.3 (L) 02/18/2023   HCT 36.4 (L) 02/18/2023   MCV 83.9 02/18/2023   MCH 28.3 02/18/2023   PLT 133 (L) 02/18/2023   MCHC 33.8 02/18/2023   RDW 13.2 02/18/2023   LYMPHSABS 1.9 02/10/2020   MONOABS 0.6 02/10/2020   EOSABS 0.1 02/10/2020   BASOSABS 0.1 02/10/2020     Last metabolic panel Lab Results  Component Value Date   NA 137 02/17/2023   K 3.7 02/17/2023   CL 105 02/17/2023   CO2 24 02/17/2023   BUN 17 02/17/2023   CREATININE 0.99 02/17/2023   GLUCOSE 94 02/17/2023   GFRNONAA >60 02/17/2023   GFRAA >60 02/10/2020   CALCIUM 8.4 (L) 02/17/2023   PROT 6.6 04/16/2016   ALBUMIN 4.2 04/16/2016   BILITOT 0.7 04/16/2016   ALKPHOS 55 04/16/2016   AST 14 04/16/2016   ALT 13 04/16/2016   ANIONGAP 8 02/17/2023    GFR: Estimated Creatinine Clearance: 79.5 mL/min (by C-G formula based on SCr of 0.99 mg/dL).  No results found for this or any previous visit (from the past 240 hour(s)).    Radiology Studies: ECHOCARDIOGRAM COMPLETE  Result Date: 02/17/2023    ECHOCARDIOGRAM REPORT   Patient Name:   Billy Baker Date of Exam: 02/17/2023 Medical Rec #:  [id number]   Height:       66.0 in Accession #:    [id number]  Weight:       170.0 lb Date of Birth:  [birth year]   BSA:          1.866 m Patient Age:    57 years    BP:           122/79 mmHg Patient Gender: M           HR:           63 bpm. Exam Location:  Inpatient Procedure: 2D Echo, Cardiac Doppler and Color Doppler Indications:     Chest Pain  History:         Patient has no prior history of Echocardiogram examinations.                   CAD, Signs/Symptoms:Chest Pain; Risk Factors:Dyslipidemia,                  Hypertension and Sleep Apnea.  Sonographer:     Raeford Razor Referring Phys:  [id number] Cecille Po MELVIN Diagnosing Phys: Epifanio Lesches MD IMPRESSIONS  1. Left ventricular ejection fraction, by estimation, is 65 to 70%. The left ventricle has normal function. The left ventricle has no regional wall motion abnormalities. There is mild left ventricular hypertrophy. Left ventricular diastolic parameters were normal.  2. Right ventricular systolic function is normal. The right ventricular size is normal. Tricuspid regurgitation signal is inadequate for assessing PA pressure.  3. The mitral valve is normal in structure. Trivial mitral valve regurgitation. No evidence of mitral stenosis.  4. The aortic valve is tricuspid. Aortic valve regurgitation is mild. No aortic stenosis is present.  5. Aortic dilatation noted.  There is mild dilatation of the ascending aorta, measuring 39 mm.  6. The inferior vena cava is normal in size with greater than 50% respiratory variability, suggesting right atrial pressure of 3 mmHg. FINDINGS  Left Ventricle: Left ventricular ejection fraction, by estimation, is 65 to 70%. The left ventricle has normal function. The left ventricle has no regional wall motion abnormalities. The left ventricular internal cavity size was normal in size. There is  mild left ventricular hypertrophy. Left ventricular diastolic parameters were normal. Right Ventricle: The right ventricular size is normal. No increase in right ventricular wall thickness. Right ventricular systolic function is normal. Tricuspid regurgitation signal is inadequate for assessing PA pressure. Left Atrium: Left atrial size was normal in size. Right Atrium: Right atrial size was normal in size. Pericardium: There is no evidence of pericardial effusion. Mitral Valve: The mitral valve is normal in structure. Trivial mitral valve regurgitation. No  evidence of mitral valve stenosis. Tricuspid Valve: The tricuspid valve is normal in structure. Tricuspid valve regurgitation is trivial. Aortic Valve: The aortic valve is tricuspid. Aortic valve regurgitation is mild. No aortic stenosis is present. Aortic valve peak gradient measures 9.4 mmHg. Pulmonic Valve: The pulmonic valve was not well visualized. Pulmonic valve regurgitation is trivial. Aorta: The aortic root is normal in size and structure and aortic dilatation noted. There is mild dilatation of the ascending aorta, measuring 39 mm. Venous: The inferior vena cava is normal in size with greater than 50% respiratory variability, suggesting right atrial pressure of 3 mmHg. IAS/Shunts: The interatrial septum was not well visualized.  LEFT VENTRICLE PLAX 2D LVIDd:         4.50 cm   Diastology LVIDs:         2.90 cm   LV e' medial:    8.59 cm/s LV PW:         1.10 cm   LV E/e' medial:  11.2 LV IVS:        1.10 cm   LV e' lateral:   12.30 cm/s LVOT diam:     1.90 cm   LV E/e' lateral: 7.8 LV SV:         58 LV SV Index:   31 LVOT Area:     2.84 cm  RIGHT VENTRICLE             IVC RV Basal diam:  2.90 cm     IVC diam: 1.30 cm RV S prime:     12.70 cm/s TAPSE (M-mode): 2.3 cm LEFT ATRIUM             Index        RIGHT ATRIUM           Index LA diam:        3.80 cm 2.04 cm/m   RA Area:     13.90 cm LA Vol (A2C):   44.6 ml 23.90 ml/m  RA Volume:   34.90 ml  18.70 ml/m LA Vol (A4C):   42.0 ml 22.51 ml/m LA Biplane Vol: 44.1 ml 23.63 ml/m  AORTIC VALVE AV Area (Vmax): 1.85 cm AV Vmax:        153.00 cm/s AV Peak Grad:   9.4 mmHg LVOT Vmax:      100.00 cm/s LVOT Vmean:     64.500 cm/s LVOT VTI:       0.203 m  AORTA Ao Root diam: 2.90 cm Ao Asc diam:  3.90 cm MITRAL VALVE MV Area (PHT): 3.65 cm    SHUNTS  MV Decel Time: 208 msec    Systemic VTI:  0.20 m MV E velocity: 96.20 cm/s  Systemic Diam: 1.90 cm MV A velocity: 89.10 cm/s MV E/A ratio:  1.08 Epifanio Lesches MD Electronically signed by Epifanio Lesches  MD Signature Date/Time: 02/17/2023/4:24:13 PM    Final (Updated)    CARDIAC CATHETERIZATION  Result Date: 02/17/2023 2 vessel occlusive CAD. There is a 90% segmental stenosis in a small distal LCx vessel. This vessel is only 1.5-1.75 mm in diameter. The right PDA is small and chronically occluded. Normal LV function Normal LVEDP Plan: recommend medical therapy.      LOS: 2 days    Jacquelin Hawking, MD [facility] 02/18/2023, 9:01 AM   If 7PM-7AM, please contact night-coverage [email or web address]

## 2023-02-18 NOTE — Plan of Care (Signed)

## 2023-02-19 ENCOUNTER — Other Ambulatory Visit (HOSPITAL_COMMUNITY): Payer: Self-pay

## 2023-02-19 DIAGNOSIS — K2100 Gastro-esophageal reflux disease with esophagitis, without bleeding: Secondary | ICD-10-CM | POA: Diagnosis not present

## 2023-02-19 DIAGNOSIS — R079 Chest pain, unspecified: Secondary | ICD-10-CM | POA: Diagnosis not present

## 2023-02-19 DIAGNOSIS — I10 Essential (primary) hypertension: Secondary | ICD-10-CM | POA: Diagnosis not present

## 2023-02-19 DIAGNOSIS — I2510 Atherosclerotic heart disease of native coronary artery without angina pectoris: Secondary | ICD-10-CM | POA: Diagnosis not present

## 2023-02-19 LAB — CBC
HCT: 39.2 % (ref 39.0–52.0)
Hemoglobin: 13.2 g/dL (ref 13.0–17.0)
MCH: 28 pg (ref 26.0–34.0)
MCHC: 33.7 g/dL (ref 30.0–36.0)
MCV: 83.1 fL (ref 80.0–100.0)
Platelets: 136 10*3/uL — ABNORMAL LOW (ref 150–400)
RBC: 4.72 MIL/uL (ref 4.22–5.81)
RDW: 13.2 % (ref 11.5–15.5)
WBC: 7.1 10*3/uL (ref 4.0–10.5)
nRBC: 0 % (ref 0.0–0.2)

## 2023-02-19 LAB — CULTURE, BLOOD (ROUTINE X 2)
Culture: NO GROWTH
Special Requests: ADEQUATE

## 2023-02-19 LAB — LIPOPROTEIN A (LPA): Lipoprotein (a): 173.9 nmol/L — ABNORMAL HIGH (ref <75.0)

## 2023-02-19 MED ORDER — PANTOPRAZOLE SODIUM 40 MG PO TBEC: 40.0000 mg | DELAYED_RELEASE_TABLET | Freq: Every day | ORAL | 0 refills | Status: AC

## 2023-02-19 MED ORDER — RANOLAZINE ER 1000 MG PO TB12: 1000.0000 mg | ORAL_TABLET | Freq: Two times a day (BID) | ORAL | 0 refills | Status: AC

## 2023-02-19 MED ORDER — NITROGLYCERIN 0.4 MG SL SUBL: 0.4000 mg | SUBLINGUAL_TABLET | SUBLINGUAL | 0 refills | Status: AC | PRN

## 2023-02-19 MED ORDER — CLOPIDOGREL BISULFATE 75 MG PO TABS: 75.0000 mg | ORAL_TABLET | Freq: Every day | ORAL | 0 refills | Status: AC

## 2023-02-19 MED ORDER — CARVEDILOL 12.5 MG PO TABS: 12.5000 mg | ORAL_TABLET | Freq: Two times a day (BID) | ORAL | 0 refills | Status: AC

## 2023-02-19 MED ORDER — ISOSORBIDE MONONITRATE ER 30 MG PO TB24
30.0000 mg | ORAL_TABLET | Freq: Every day | ORAL | Status: DC
  Administered 2023-02-19: 30 mg via ORAL
  Filled 2023-02-19: qty 1

## 2023-02-19 MED ORDER — ISOSORBIDE MONONITRATE ER 30 MG PO TB24: 30.0000 mg | ORAL_TABLET | Freq: Every day | ORAL | 0 refills | Status: AC

## 2023-02-19 NOTE — Discharge Summary (Signed)
Physician Discharge Summary  Billy Baker ZOX:[id number] DOB: [birth year] DOA: 02/15/2023  PCP: [facility]  Admit date: 02/15/2023 Discharge date: 02/19/2023  Admitted From: Home Disposition: Home  Recommendations for Outpatient Follow-up:  Follow up with PCP in 1-2 weeks Follow-up with cardiology at the [facility] as scheduled  Home Health: None Equipment/Devices: None  Discharge Condition: Stable CODE STATUS: Full Diet recommendation: Low-salt low-fat low-carb diet  Brief/Interim Summary: Billy Baker is a 57 y.o. male with a history of hypertension, hyperlipidemia, CAD, GERD, CKD, ulcerative colitis, PTSD, depression, anxiety, OSA. Patient presented secondary to chest pain concerning for ACS. Cardiology consulted.   Patient mated for chest pain concerning for ACS, patient started on heparin and nitroglycerin drip. Cardiology performed a left heart catheterization which is significant for two-vessel disease with recommendation for medical management. Patient with continued episodes of chest pain and cardiology is adjusting medication to better manage anginal symptoms.  At this time patient's symptoms are markedly well-controlled, recommend close follow-up with [facility] cardiologist for further evaluation and management by his primary care team but otherwise is stable and agreeable for discharge home.  Transient episode of fever with nonspecific symptoms with negative cultures and imaging, no indication for antibiotics, questionably reactive.  Discharge Diagnoses:  Principal Problem:   Chest pain, rule out acute myocardial infarction Active Problems:   HTN (hypertension)   HLD (hyperlipidemia)   CAD (coronary artery disease)   GERD (gastroesophageal reflux disease)   Post-traumatic stress disorder, chronic   Obstructive sleep apnea syndrome   Adjustment disorder with depressed mood   Ulcerative colitis (HCC)   Anxiety    Discharge Instructions   Allergies as of 02/19/2023   No  Known Allergies      Medication List     STOP taking these medications    hydrochlorothiazide 25 MG tablet Commonly known as: HYDRODIURIL       TAKE these medications    aspirin EC 81 MG tablet Take 81 mg by mouth daily.   carvedilol 12.5 MG tablet Commonly known as: COREG Take 1 tablet (12.5 mg total) by mouth 2 (two) times daily with a meal. What changed:  medication strength when to take this   cetirizine 10 MG chewable tablet Commonly known as: ZYRTEC Chew 10 mg by mouth daily.   clopidogrel 75 MG tablet Commonly known as: PLAVIX Take 1 tablet (75 mg total) by mouth daily. Start taking on: February 20, 2023   isosorbide mononitrate 30 MG 24 hr tablet Commonly known as: IMDUR Take 1 tablet (30 mg total) by mouth daily. Start taking on: February 20, 2023   losartan 100 MG tablet Commonly known as: COZAAR Take 100 mg by mouth daily.   nitroGLYCERIN 0.4 MG SL tablet Commonly known as: NITROSTAT Place 1 tablet (0.4 mg total) under the tongue every 5 (five) minutes as needed for chest pain.   OVER THE COUNTER MEDICATION Take 1 capsule by mouth daily. Seamoss   pantoprazole 40 MG tablet Commonly known as: PROTONIX Take 1 tablet (40 mg total) by mouth daily at 6 (six) AM. Start taking on: February 20, 2023   ranolazine 1000 MG SR tablet Commonly known as: RANEXA Take 1 tablet (1,000 mg total) by mouth 2 (two) times daily.   rosuvastatin 40 MG tablet Commonly known as: CRESTOR Take 40 mg by mouth daily.   valACYclovir 500 MG tablet Commonly known as: VALTREX Take 500 mg by mouth 2 (two) times daily as needed (flares).        No Known  Allergies  Consultations: Cardiology  Procedures/Studies: ECHOCARDIOGRAM COMPLETE  Result Date: 02/17/2023    ECHOCARDIOGRAM REPORT   Patient Name:   Billy Baker Date of Exam: 02/17/2023 Medical Rec #:  [id number]   Height:       66.0 in Accession #:    [id number]  Weight:       170.0 lb Date of Birth:  1966/04/27   BSA:           1.866 m Patient Age:    57 years    BP:           122/79 mmHg Patient Gender: M           HR:           63 bpm. Exam Location:  Inpatient Procedure: 2D Echo, Cardiac Doppler and Color Doppler Indications:     Chest Pain  History:         Patient has no prior history of Echocardiogram examinations.                  CAD, Signs/Symptoms:Chest Pain; Risk Factors:Dyslipidemia,                  Hypertension and Sleep Apnea.  Sonographer:     Raeford Razor Referring Phys:  [id number] Cecille Po MELVIN Diagnosing Phys: Epifanio Lesches MD IMPRESSIONS  1. Left ventricular ejection fraction, by estimation, is 65 to 70%. The left ventricle has normal function. The left ventricle has no regional wall motion abnormalities. There is mild left ventricular hypertrophy. Left ventricular diastolic parameters were normal.  2. Right ventricular systolic function is normal. The right ventricular size is normal. Tricuspid regurgitation signal is inadequate for assessing PA pressure.  3. The mitral valve is normal in structure. Trivial mitral valve regurgitation. No evidence of mitral stenosis.  4. The aortic valve is tricuspid. Aortic valve regurgitation is mild. No aortic stenosis is present.  5. Aortic dilatation noted. There is mild dilatation of the ascending aorta, measuring 39 mm.  6. The inferior vena cava is normal in size with greater than 50% respiratory variability, suggesting right atrial pressure of 3 mmHg. FINDINGS  Left Ventricle: Left ventricular ejection fraction, by estimation, is 65 to 70%. The left ventricle has normal function. The left ventricle has no regional wall motion abnormalities. The left ventricular internal cavity size was normal in size. There is  mild left ventricular hypertrophy. Left ventricular diastolic parameters were normal. Right Ventricle: The right ventricular size is normal. No increase in right ventricular wall thickness. Right ventricular systolic function is normal. Tricuspid regurgitation  signal is inadequate for assessing PA pressure. Left Atrium: Left atrial size was normal in size. Right Atrium: Right atrial size was normal in size. Pericardium: There is no evidence of pericardial effusion. Mitral Valve: The mitral valve is normal in structure. Trivial mitral valve regurgitation. No evidence of mitral valve stenosis. Tricuspid Valve: The tricuspid valve is normal in structure. Tricuspid valve regurgitation is trivial. Aortic Valve: The aortic valve is tricuspid. Aortic valve regurgitation is mild. No aortic stenosis is present. Aortic valve peak gradient measures 9.4 mmHg. Pulmonic Valve: The pulmonic valve was not well visualized. Pulmonic valve regurgitation is trivial. Aorta: The aortic root is normal in size and structure and aortic dilatation noted. There is mild dilatation of the ascending aorta, measuring 39 mm. Venous: The inferior vena cava is normal in size with greater than 50% respiratory variability, suggesting right atrial pressure of 3 mmHg. IAS/Shunts: The interatrial septum was not  well visualized.  LEFT VENTRICLE PLAX 2D LVIDd:         4.50 cm   Diastology LVIDs:         2.90 cm   LV e' medial:    8.59 cm/s LV PW:         1.10 cm   LV E/e' medial:  11.2 LV IVS:        1.10 cm   LV e' lateral:   12.30 cm/s LVOT diam:     1.90 cm   LV E/e' lateral: 7.8 LV SV:         58 LV SV Index:   31 LVOT Area:     2.84 cm  RIGHT VENTRICLE             IVC RV Basal diam:  2.90 cm     IVC diam: 1.30 cm RV S prime:     12.70 cm/s TAPSE (M-mode): 2.3 cm LEFT ATRIUM             Index        RIGHT ATRIUM           Index LA diam:        3.80 cm 2.04 cm/m   RA Area:     13.90 cm LA Vol (A2C):   44.6 ml 23.90 ml/m  RA Volume:   34.90 ml  18.70 ml/m LA Vol (A4C):   42.0 ml 22.51 ml/m LA Biplane Vol: 44.1 ml 23.63 ml/m  AORTIC VALVE AV Area (Vmax): 1.85 cm AV Vmax:        153.00 cm/s AV Peak Grad:   9.4 mmHg LVOT Vmax:      100.00 cm/s LVOT Vmean:     64.500 cm/s LVOT VTI:       0.203 m  AORTA Ao  Root diam: 2.90 cm Ao Asc diam:  3.90 cm MITRAL VALVE MV Area (PHT): 3.65 cm    SHUNTS MV Decel Time: 208 msec    Systemic VTI:  0.20 m MV E velocity: 96.20 cm/s  Systemic Diam: 1.90 cm MV A velocity: 89.10 cm/s MV E/A ratio:  1.08 Epifanio Lesches MD Electronically signed by Epifanio Lesches MD Signature Date/Time: 02/17/2023/4:24:13 PM    Final (Updated)    CARDIAC CATHETERIZATION  Result Date: 02/17/2023 2 vessel occlusive CAD. There is a 90% segmental stenosis in a small distal LCx vessel. This vessel is only 1.5-1.75 mm in diameter. The right PDA is small and chronically occluded. Normal LV function Normal LVEDP Plan: recommend medical therapy.   DG Chest 2 View  Result Date: 02/15/2023 CLINICAL DATA:  Chest pain EXAM: CHEST - 2 VIEW COMPARISON:  Chest x-ray 08/07/2021 FINDINGS: The heart size and mediastinal contours are within normal limits. Both lungs are clear. The visualized skeletal structures are unremarkable. IMPRESSION: No active cardiopulmonary disease. Electronically Signed   By: Darliss Cheney M.D.   On: 02/15/2023 21:51     Subjective: No acute issues or events overnight   Discharge Exam: Vitals:   02/19/23 0732 02/19/23 1128  BP: 116/79 103/65  Pulse: 67 60  Resp: 14 15  Temp: 98.3 F (36.8 C) 98.4 F (36.9 C)  SpO2: 95% 96%   Vitals:   02/19/23 0300 02/19/23 0732 02/19/23 0816 02/19/23 1128  BP: 136/85 116/79  103/65  Pulse: 60 67  60  Resp: 14 14  15   Temp: 98.5 F (36.9 C) 98.3 F (36.8 C)  98.4 F (36.9 C)  TempSrc: Oral Oral Oral Oral  SpO2: 95% 95%  96%  Weight:      Height:        General: Pt is alert, awake, not in acute distress Cardiovascular: RRR, S1/S2 +, no rubs, no gallops Respiratory: CTA bilaterally, no wheezing, no rhonchi Abdominal: Soft, NT, ND, bowel sounds + Extremities: no edema, no cyanosis    The results of significant diagnostics from this hospitalization (including imaging, microbiology, ancillary and laboratory) are  listed below for reference.     Microbiology: Recent Results (from the past 240 hour(s))  Culture, blood (Routine X 2) w Reflex to ID Panel     Status: None (Preliminary result)   Collection Time: 02/18/23 12:20 PM   Specimen: BLOOD LEFT HAND  Result Value Ref Range Status   Specimen Description BLOOD LEFT HAND  Final   Special Requests   Final    BOTTLES DRAWN AEROBIC ONLY Blood Culture results may not be optimal due to an inadequate volume of blood received in culture bottles   Culture   Final    NO GROWTH < 24 HOURS Performed at [facility], [address], [place], [place] [address]    Report Status PENDING  Incomplete  Culture, blood (Routine X 2) w Reflex to ID Panel     Status: None (Preliminary result)   Collection Time: 02/18/23 12:24 PM   Specimen: BLOOD LEFT HAND  Result Value Ref Range Status   Specimen Description BLOOD LEFT HAND  Final   Special Requests   Final    BOTTLES DRAWN AEROBIC ONLY Blood Culture adequate volume   Culture   Final    NO GROWTH < 24 HOURS Performed at [facility], [address], [place], [place] [address]    Report Status PENDING  Incomplete  Respiratory (~20 pathogens) panel by PCR     Status: None   Collection Time: 02/18/23 12:50 PM   Specimen: Nasopharyngeal Swab; Respiratory  Result Value Ref Range Status   Adenovirus NOT DETECTED NOT DETECTED Final   Coronavirus 229E NOT DETECTED NOT DETECTED Final    Comment: (NOTE) The Coronavirus on the Respiratory Panel, DOES NOT test for the novel  Coronavirus (2019 nCoV)    Coronavirus HKU1 NOT DETECTED NOT DETECTED Final   Coronavirus NL63 NOT DETECTED NOT DETECTED Final   Coronavirus OC43 NOT DETECTED NOT DETECTED Final   Metapneumovirus NOT DETECTED NOT DETECTED Final   Rhinovirus / Enterovirus NOT DETECTED NOT DETECTED Final   Influenza A NOT DETECTED NOT DETECTED Final   Influenza B NOT DETECTED NOT DETECTED Final   Parainfluenza Virus 1 NOT DETECTED NOT DETECTED Final    Parainfluenza Virus 2 NOT DETECTED NOT DETECTED Final   Parainfluenza Virus 3 NOT DETECTED NOT DETECTED Final   Parainfluenza Virus 4 NOT DETECTED NOT DETECTED Final   Respiratory Syncytial Virus NOT DETECTED NOT DETECTED Final   Bordetella pertussis NOT DETECTED NOT DETECTED Final   Bordetella Parapertussis NOT DETECTED NOT DETECTED Final   Chlamydophila pneumoniae NOT DETECTED NOT DETECTED Final   Mycoplasma pneumoniae NOT DETECTED NOT DETECTED Final    Comment: Performed at [facility], [address] [place], [place] [address]     Labs: BNP (last 3 results) No results for input(s): "BNP" in the last 8760 hours. Basic Metabolic Panel: Recent Labs  Lab 02/15/23 2118 02/16/23 1441 02/17/23 0023 02/17/23 1223  NA 137 138 137  --   K 3.4* 3.8 3.7  --   CL 102 100 105  --  CO2 25 24 24   --   GLUCOSE 123* 110* 94  --   BUN 20 17 17   --   CREATININE 0.84 0.92 1.10 0.99  CALCIUM 8.8* 8.8* 8.4*  --   MG  --  2.1  --   --    Liver Function Tests: Recent Labs  Lab 02/18/23 1049  AST 25  ALT 23  ALKPHOS 45  BILITOT 0.6  PROT 6.3*  ALBUMIN 3.3*   No results for input(s): "LIPASE", "AMYLASE" in the last 168 hours. No results for input(s): "AMMONIA" in the last 168 hours. CBC: Recent Labs  Lab 02/15/23 2119 02/16/23 1441 02/17/23 0023 02/18/23 0245 02/19/23 0247  WBC 4.8 4.9 5.9 6.7 7.1  HGB 14.0 14.2 12.7* 12.3* 13.2  HCT 41.4 43.5 38.3* 36.4* 39.2  MCV 82.6 83.8 83.1 83.9 83.1  PLT 173 136* 133* 133* 136*   Cardiac Enzymes: No results for input(s): "CKTOTAL", "CKMB", "CKMBINDEX", "TROPONINI" in the last 168 hours. BNP: Invalid input(s): "POCBNP" CBG: No results for input(s): "GLUCAP" in the last 168 hours. D-Dimer No results for input(s): "DDIMER" in the last 72 hours. Hgb A1c Recent Labs    02/18/23 0245  HGBA1C 5.8*   Lipid Profile Recent Labs    02/17/23 0023  CHOL 118  HDL 50  LDLCALC 58  TRIG 51  CHOLHDL 2.4   Thyroid function  studies No results for input(s): "TSH", "T4TOTAL", "T3FREE", "THYROIDAB" in the last 72 hours.  Invalid input(s): "FREET3" Anemia work up No results for input(s): "VITAMINB12", "FOLATE", "FERRITIN", "TIBC", "IRON", "RETICCTPCT" in the last 72 hours. Urinalysis No results found for: "COLORURINE", "APPEARANCEUR", "LABSPEC", "PHURINE", "GLUCOSEU", "HGBUR", "BILIRUBINUR", "KETONESUR", "PROTEINUR", "UROBILINOGEN", "NITRITE", "LEUKOCYTESUR" Sepsis Labs Recent Labs  Lab 02/16/23 1441 02/17/23 0023 02/18/23 0245 02/19/23 0247  WBC 4.9 5.9 6.7 7.1   Microbiology Recent Results (from the past 240 hour(s))  Culture, blood (Routine X 2) w Reflex to ID Panel     Status: None (Preliminary result)   Collection Time: 02/18/23 12:20 PM   Specimen: BLOOD LEFT HAND  Result Value Ref Range Status   Specimen Description BLOOD LEFT HAND  Final   Special Requests   Final    BOTTLES DRAWN AEROBIC ONLY Blood Culture results may not be optimal due to an inadequate volume of blood received in culture bottles   Culture   Final    NO GROWTH < 24 HOURS Performed at [facility], [address] [place], [place] [address]    Report Status PENDING  Incomplete  Culture, blood (Routine X 2) w Reflex to ID Panel     Status: None (Preliminary result)   Collection Time: 02/18/23 12:24 PM   Specimen: BLOOD LEFT HAND  Result Value Ref Range Status   Specimen Description BLOOD LEFT HAND  Final   Special Requests   Final    BOTTLES DRAWN AEROBIC ONLY Blood Culture adequate volume   Culture   Final    NO GROWTH < 24 HOURS Performed at [facility], [address], [place], [place] [address]    Report Status PENDING  Incomplete  Respiratory (~20 pathogens) panel by PCR     Status: None   Collection Time: 02/18/23 12:50 PM   Specimen: Nasopharyngeal Swab; Respiratory  Result Value Ref Range Status   Adenovirus NOT DETECTED NOT DETECTED Final   Coronavirus 229E NOT DETECTED NOT DETECTED Final     Comment: (NOTE) The Coronavirus on the Respiratory Panel, DOES NOT test for the  novel  Coronavirus (2019 nCoV)    Coronavirus HKU1 NOT DETECTED NOT DETECTED Final   Coronavirus NL63 NOT DETECTED NOT DETECTED Final   Coronavirus OC43 NOT DETECTED NOT DETECTED Final   Metapneumovirus NOT DETECTED NOT DETECTED Final   Rhinovirus / Enterovirus NOT DETECTED NOT DETECTED Final   Influenza A NOT DETECTED NOT DETECTED Final   Influenza B NOT DETECTED NOT DETECTED Final   Parainfluenza Virus 1 NOT DETECTED NOT DETECTED Final   Parainfluenza Virus 2 NOT DETECTED NOT DETECTED Final   Parainfluenza Virus 3 NOT DETECTED NOT DETECTED Final   Parainfluenza Virus 4 NOT DETECTED NOT DETECTED Final   Respiratory Syncytial Virus NOT DETECTED NOT DETECTED Final   Bordetella pertussis NOT DETECTED NOT DETECTED Final   Bordetella Parapertussis NOT DETECTED NOT DETECTED Final   Chlamydophila pneumoniae NOT DETECTED NOT DETECTED Final   Mycoplasma pneumoniae NOT DETECTED NOT DETECTED Final    Comment: Performed at [facility], [address] [place], [place] [address]     Time coordinating discharge: Over 30 minutes  SIGNED:   Azucena Fallen, DO [facility] 02/19/2023, 3:40 PM Pager   If 7PM-7AM, please contact night-coverage [email or web address]

## 2023-02-19 NOTE — Progress Notes (Signed)
Mobility Specialist Progress Note:   02/19/23 0949  Mobility  Activity Ambulated independently in hallway  Level of Assistance Standby assist, set-up cues, supervision of patient - no hands on  Assistive Device None  Distance Ambulated (ft) 340 ft  Activity Response Tolerated well  Mobility Referral Yes  $Mobility charge 1 Mobility  Mobility Specialist Start Time (ACUTE ONLY) 0930  Mobility Specialist Stop Time (ACUTE ONLY) 0945  Mobility Specialist Time Calculation (min) (ACUTE ONLY) 15 min    Pre Mobility: 65 HR ,  99% SpO2 RA  During Mobility: 76 HR ,  95% SpO2 RA   Pt received in bed, agreeable to mobility. Denied any SOB or discomfort during ambulation. Asymptomatic throughout. Pt returned to bed with call bell near and all needs met.  Leory Plowman  Mobility Specialist Please contact via [employer] office at [phone]

## 2023-02-19 NOTE — Progress Notes (Signed)
Progress Note  Patient Name: Billy Baker Date of Encounter: 02/19/2023  Primary Cardiologist: None   Subjective   Patient seen examined his bedside.  His wife at the bedside when I arrived.    Inpatient Medications    Scheduled Meds:  aspirin EC  81 mg Oral Daily   carvedilol  12.5 mg Oral BID WC   clopidogrel  75 mg Oral Daily   heparin  5,000 Units Subcutaneous Q8H   losartan  100 mg Oral Daily   nitroGLYCERIN  0.4 mg Transdermal Daily   pantoprazole  40 mg Oral Q0600   ranolazine  1,000 mg Oral BID   rosuvastatin  40 mg Oral Daily   sodium chloride flush  3 mL Intravenous Q12H   Continuous Infusions:  sodium chloride     PRN Meds: sodium chloride, acetaminophen, morphine injection, nitroGLYCERIN, ondansetron (ZOFRAN) IV, sodium chloride flush   Vital Signs    Vitals:   02/18/23 2327 02/19/23 0300 02/19/23 0732 02/19/23 0816  BP: 129/81 136/85 116/79   Pulse: 67 60 67   Resp: 17 14 14    Temp: 98.4 F (36.9 C) 98.5 F (36.9 C) 98.3 F (36.8 C)   TempSrc: Oral Oral Oral Oral  SpO2: 96% 95% 95%   Weight:      Height:        Intake/Output Summary (Last 24 hours) at 02/19/2023 1017 Last data filed at 02/19/2023 0300 Gross per 24 hour  Intake 120 ml  Output 2400 ml  Net -2280 ml   Filed Weights   02/15/23 2110 02/15/23 2333  Weight: 77.1 kg 77.1 kg    Telemetry    Sinus rhythm- Personally Reviewed  ECG    None today- Personally Reviewed  Physical Exam   General: Awake in bed, no acute distress Head: Atraumatic, normal size  Eyes: PEERLA, EOMI  Neck: Supple, normal JVD Cardiac: Normal S1, S2; RRR; no murmurs, rubs, or gallops Lungs: Clear to auscultation bilaterally Abd: Soft, nontender, no hepatomegaly  Ext: warm, no edema Musculoskeletal: No deformities, BUE and BLE strength normal and equal Skin: Warm and dry, no rashes   Neuro: Alert and oriented to person, place, time, and situation, CNII-XII grossly intact, no focal deficits  Psych:  Normal mood and affect   Labs    Chemistry Recent Labs  Lab 02/15/23 2118 02/16/23 1441 02/17/23 0023 02/17/23 1223 02/18/23 1049  NA 137 138 137  --   --   K 3.4* 3.8 3.7  --   --   CL 102 100 105  --   --   CO2 25 24 24   --   --   GLUCOSE 123* 110* 94  --   --   BUN 20 17 17   --   --   CREATININE 0.84 0.92 1.10 0.99  --   CALCIUM 8.8* 8.8* 8.4*  --   --   PROT  --   --   --   --  6.3*  ALBUMIN  --   --   --   --  3.3*  AST  --   --   --   --  25  ALT  --   --   --   --  23  ALKPHOS  --   --   --   --  45  BILITOT  --   --   --   --  0.6  GFRNONAA >60 >60 >60 >60  --   ANIONGAP 10 14 8   --   --  Hematology Recent Labs  Lab 02/17/23 0023 02/18/23 0245 02/19/23 0247  WBC 5.9 6.7 7.1  RBC 4.61 4.34 4.72  HGB 12.7* 12.3* 13.2  HCT 38.3* 36.4* 39.2  MCV 83.1 83.9 83.1  MCH 27.5 28.3 28.0  MCHC 33.2 33.8 33.7  RDW 13.1 13.2 13.2  PLT 133* 133* 136*    Cardiac EnzymesNo results for input(s): "TROPONINI" in the last 168 hours. No results for input(s): "TROPIPOC" in the last 168 hours.   BNPNo results for input(s): "BNP", "PROBNP" in the last 168 hours.   DDimer No results for input(s): "DDIMER" in the last 168 hours.   Radiology    ECHOCARDIOGRAM COMPLETE  Result Date: 02/17/2023    ECHOCARDIOGRAM REPORT   Patient Name:   Billy Baker Date of Exam: 02/17/2023 Medical Rec #:  [id number]   Height:       66.0 in Accession #:    [id number]  Weight:       170.0 lb Date of Birth:  March 06, 1966   BSA:          1.866 m Patient Age:    57 years    BP:           122/79 mmHg Patient Gender: M           HR:           63 bpm. Exam Location:  Inpatient Procedure: 2D Echo, Cardiac Doppler and Color Doppler Indications:     Chest Pain  History:         Patient has no prior history of Echocardiogram examinations.                  CAD, Signs/Symptoms:Chest Pain; Risk Factors:Dyslipidemia,                  Hypertension and Sleep Apnea.  Sonographer:     Raeford Razor Referring Phys:   [id number] Cecille Po MELVIN Diagnosing Phys: Epifanio Lesches MD IMPRESSIONS  1. Left ventricular ejection fraction, by estimation, is 65 to 70%. The left ventricle has normal function. The left ventricle has no regional wall motion abnormalities. There is mild left ventricular hypertrophy. Left ventricular diastolic parameters were normal.  2. Right ventricular systolic function is normal. The right ventricular size is normal. Tricuspid regurgitation signal is inadequate for assessing PA pressure.  3. The mitral valve is normal in structure. Trivial mitral valve regurgitation. No evidence of mitral stenosis.  4. The aortic valve is tricuspid. Aortic valve regurgitation is mild. No aortic stenosis is present.  5. Aortic dilatation noted. There is mild dilatation of the ascending aorta, measuring 39 mm.  6. The inferior vena cava is normal in size with greater than 50% respiratory variability, suggesting right atrial pressure of 3 mmHg. FINDINGS  Left Ventricle: Left ventricular ejection fraction, by estimation, is 65 to 70%. The left ventricle has normal function. The left ventricle has no regional wall motion abnormalities. The left ventricular internal cavity size was normal in size. There is  mild left ventricular hypertrophy. Left ventricular diastolic parameters were normal. Right Ventricle: The right ventricular size is normal. No increase in right ventricular wall thickness. Right ventricular systolic function is normal. Tricuspid regurgitation signal is inadequate for assessing PA pressure. Left Atrium: Left atrial size was normal in size. Right Atrium: Right atrial size was normal in size. Pericardium: There is no evidence of pericardial effusion. Mitral Valve: The mitral valve is normal in structure. Trivial mitral valve regurgitation. No evidence of mitral valve stenosis.  Tricuspid Valve: The tricuspid valve is normal in structure. Tricuspid valve regurgitation is trivial. Aortic Valve: The aortic  valve is tricuspid. Aortic valve regurgitation is mild. No aortic stenosis is present. Aortic valve peak gradient measures 9.4 mmHg. Pulmonic Valve: The pulmonic valve was not well visualized. Pulmonic valve regurgitation is trivial. Aorta: The aortic root is normal in size and structure and aortic dilatation noted. There is mild dilatation of the ascending aorta, measuring 39 mm. Venous: The inferior vena cava is normal in size with greater than 50% respiratory variability, suggesting right atrial pressure of 3 mmHg. IAS/Shunts: The interatrial septum was not well visualized.  LEFT VENTRICLE PLAX 2D LVIDd:         4.50 cm   Diastology LVIDs:         2.90 cm   LV e' medial:    8.59 cm/s LV PW:         1.10 cm   LV E/e' medial:  11.2 LV IVS:        1.10 cm   LV e' lateral:   12.30 cm/s LVOT diam:     1.90 cm   LV E/e' lateral: 7.8 LV SV:         58 LV SV Index:   31 LVOT Area:     2.84 cm  RIGHT VENTRICLE             IVC RV Basal diam:  2.90 cm     IVC diam: 1.30 cm RV S prime:     12.70 cm/s TAPSE (M-mode): 2.3 cm LEFT ATRIUM             Index        RIGHT ATRIUM           Index LA diam:        3.80 cm 2.04 cm/m   RA Area:     13.90 cm LA Vol (A2C):   44.6 ml 23.90 ml/m  RA Volume:   34.90 ml  18.70 ml/m LA Vol (A4C):   42.0 ml 22.51 ml/m LA Biplane Vol: 44.1 ml 23.63 ml/m  AORTIC VALVE AV Area (Vmax): 1.85 cm AV Vmax:        153.00 cm/s AV Peak Grad:   9.4 mmHg LVOT Vmax:      100.00 cm/s LVOT Vmean:     64.500 cm/s LVOT VTI:       0.203 m  AORTA Ao Root diam: 2.90 cm Ao Asc diam:  3.90 cm MITRAL VALVE MV Area (PHT): 3.65 cm    SHUNTS MV Decel Time: 208 msec    Systemic VTI:  0.20 m MV E velocity: 96.20 cm/s  Systemic Diam: 1.90 cm MV A velocity: 89.10 cm/s MV E/A ratio:  1.08 Epifanio Lesches MD Electronically signed by Epifanio Lesches MD Signature Date/Time: 02/17/2023/4:24:13 PM    Final (Updated)    CARDIAC CATHETERIZATION  Result Date: 02/17/2023 2 vessel occlusive CAD. There is a 90%  segmental stenosis in a small distal LCx vessel. This vessel is only 1.5-1.75 mm in diameter. The right PDA is small and chronically occluded. Normal LV function Normal LVEDP Plan: recommend medical therapy.    Cardiac Studies   None  Patient Profile     57 y.o. male with history of coronary artery disease that has been medically currently managed in the past, hypertension, hyperlipidemia, OSA.  Assessment & Plan    Coronary artery disease-status post left heart catheterization yesterday, no indication for PCI he does have a  very small circumflex vessel with mid to distal stenosis 90%.  Not amenable to PCI given the vessel size.  Medical management has been advised which I think is reasonable at this time.  I explained this to the patient and his wife.  His Ranexa has been increased to 1000 mg twice daily, will switch the patient from Nitropatch to Imdur 30 mg daily.continue his dual antiplatelet therapy and Crestor.  I did explain to the patient his wife as well he may need cardiac rehab he follows with the [place].   Blood pressure is acceptable, continue with current antihypertensive regimen.  Hyperlipidemia - continue with current statin medication.  Please add on hemoglobin A1c to his current testing labs.     [facility] will sign off.   Medication Recommendations: Aspirin 81 mg daily, Plavix 75 mg daily, Imdur 30 mg a day, Ranexa 100 mg twice daily, Crestor 40 mg daily, Coreg 12.5 mg twice daily Other recommendations (labs, testing, etc): [facility] Follow up as an outpatient:  [facility]     Signed, Thomasene Ripple, DO  02/19/2023, 8:33 AM

## 2023-02-19 NOTE — Progress Notes (Signed)
Nurse requested Mobility Specialist to perform oxygen saturation test with pt which includes removing pt from oxygen both at rest and while ambulating.  Below are the results from that testing.     Patient Saturations on Room Air at Rest = spO2 98%  Patient Saturations on Room Air while Ambulating = sp02 95% .  Rested and performed pursed lip breathing for 1 minute with sp02 at 96%.  Reported results to nurse.

## 2023-02-19 NOTE — TOC Benefit Eligibility Note (Signed)
Pharmacy Patient Advocate Encounter  Insurance verification completed.    The patient is insured through [employer] test claim for clopidogrel (Plavix) 75 mg and the current 30 day co-pay is $0.00.   This test claim was processed through [facility]- copay amounts may vary at other pharmacies due to pharmacy/plan contracts, or as the patient moves through the different stages of their insurance plan.    Roland Earl, CPHT Pharmacy Patient Advocate Specialist [facility] Patient Advocate Team Direct Number: [phone]  Fax: [phone]

## 2023-02-19 NOTE — Progress Notes (Signed)
Explained discharge instructions to patient. Reviewed follow up appointment and next medication administration times. Also reviewed education. Patient verbalized having an understanding for instructions given. All belongings are in the patient's possession. IV and telemetry were removed. CCMD was notified. No other needs verbalized. Will transport out for discharge.

## 2023-02-19 NOTE — Plan of Care (Signed)
  Problem: Education: Goal: Knowledge of General Education information will improve Description: Including pain rating scale, medication(s)/side effects and non-pharmacologic comfort measures Outcome: Progressing   Problem: Health Behavior/Discharge Planning: Goal: Ability to manage health-related needs will improve Outcome: Progressing   Problem: Clinical Measurements: Goal: Ability to maintain clinical measurements within normal limits will improve Outcome: Progressing Goal: Will remain free from infection Outcome: Progressing Goal: Diagnostic test results will improve Outcome: Progressing Goal: Respiratory complications will improve Outcome: Progressing Goal: Cardiovascular complication will be avoided Outcome: Progressing   Problem: Activity: Goal: Risk for activity intolerance will decrease Outcome: Progressing   Problem: Nutrition: Goal: Adequate nutrition will be maintained Outcome: Progressing   Problem: Coping: Goal: Level of anxiety will decrease Outcome: Progressing   Problem: Elimination: Goal: Will not experience complications related to bowel motility Outcome: Progressing Goal: Will not experience complications related to urinary retention Outcome: Progressing   Problem: Pain Managment: Goal: General experience of comfort will improve Outcome: Progressing   Problem: Safety: Goal: Ability to remain free from injury will improve Outcome: Progressing   Problem: Skin Integrity: Goal: Risk for impaired skin integrity will decrease Outcome: Progressing   Problem: Education: Goal: Understanding of cardiac disease, CV risk reduction, and recovery process will improve Outcome: Progressing Goal: Individualized Educational Video(s) Outcome: Progressing   Problem: Activity: Goal: Ability to tolerate increased activity will improve Outcome: Progressing   Problem: Cardiac: Goal: Ability to achieve and maintain adequate cardiovascular perfusion will  improve Outcome: Progressing   Problem: Health Behavior/Discharge Planning: Goal: Ability to safely manage health-related needs after discharge will improve Outcome: Progressing   Problem: Education: Goal: Understanding of CV disease, CV risk reduction, and recovery process will improve Outcome: Progressing Goal: Individualized Educational Video(s) Outcome: Progressing   Problem: Activity: Goal: Ability to return to baseline activity level will improve Outcome: Progressing   Problem: Cardiovascular: Goal: Ability to achieve and maintain adequate cardiovascular perfusion will improve Outcome: Progressing Goal: Vascular access site(s) Level 0-1 will be maintained Outcome: Progressing

## 2023-02-19 NOTE — Discharge Instructions (Signed)

## 2023-02-20 LAB — CULTURE, BLOOD (ROUTINE X 2): Culture: NO GROWTH

## 2023-03-12 ENCOUNTER — Encounter: Payer: Managed Care, Other (non HMO) | Admitting: *Deleted

## 2023-03-12 VITALS — BP 128/83 | Temp 98.0°F | Resp 16 | Ht 66.0 in | Wt 172.0 lb

## 2023-03-12 DIAGNOSIS — Z006 Encounter for examination for normal comparison and control in clinical research program: Secondary | ICD-10-CM

## 2023-03-12 NOTE — Research (Cosign Needed Addendum)
 AA Heart  Informed Consent   Subject Name: Billy Baker  Subject met inclusion and exclusion criteria.  The info  hs and concerns were addressed prior to the signing of the consent form.  The subject verbalized understanding of the trial requirements.  The subject agreed to participate in the AA HEART trial and signed the informed consent at 12:04 P.M. on 08-14--2024.  The informed consent was obtained prior to performance of any protocol-specific procedures for the subject.  A copy of the signed informed consent was given to the subject and a copy was placed in the subject's medical record.   Billy Baker surveyor     Are there any labs that are clinically significant?  Yes []  OR No[]   Please FORWARD back to me with any changes or follow up!      ACCESSION NO. [id number]                                             Page 1 of 1                                                        INVESTIGATOR: ([id number])                          PROTOCOL   [id number]                     Billy Baker, M.D.                              INVESTIGATOR NO.: [id number]                     c/o [facility]                         SUBJECT NUMBER: [id number]                     Billy Baker                 SUBJECT INITIALS NOT COLLECTED:                     [id number]                          VISIT: D0                     Billy Baker, Billy Baker                   SPONSOR REPORT TO:                 COLLECTION TIME:12:41 DATE:12-Mar-2023                     Billy Baker                 DATE RECEIVED IN LABORATORY: 13-Mar-2023  c/o Sponsor [employer] access         DATE REPORTED BY LABORATORY: 13-Mar-2023                     Billy Baker                          SEX: M  AGE: 77G                     [id number]                   [place] [phone]                                                                                         Ref. Ranges               Clinical    Comments                                                                          Significance                                                                            Yes*  No                    HEMATOLOGY&DIFFERENTIAL PANEL                      HGB            14.5         12.7-18.1 g/dL                      HCT            43           39-54 %                      RBC            5.1          4.5-6.4 x106/uL                      MCH            29           26-34 pg                      MCHC  34           31-38 g/dL                      RDW            14.7         12.Baker-15.Baker %                      RBC Morph      No Review Required                        MCV            84           79-96 fL                      WBC            3.69    L    3.80-10.70 x103/uL                      Neutrophil     1.91    L    1.96-7.23 x103/uL                      Lymphocyte     1.39         Baker.91-4.28 x103/uL                      Monocytes      Baker.25         Baker.12-Baker.92 x103/uL                      Eosinophil     Baker.10         Baker.00-Baker.57 x103/uL                      Basophils      Baker.04         Baker.00-Baker.20 x103/uL                      Neutrophil     51.7         40.5-75.Baker %                      Lymphocyte     37.7         15.4-48.5%                      Monocytes      6.9          2.6-10.1 %                      Eosinophil     2.7          Baker.Baker-6.8 %                      Basophils      1.2          Baker.Baker-2.Baker %                      Platelets      165          140-400 x103/uL  ANC                      ANC            1.91    L    1.96-7.23 x103/uL                    HBA1C                      Fast HbA1c     6.Baker          <6.5%  RETICULOCYTES                      Retic %        1.2          Baker.6-2.5 %                      Retic Abs      Baker.060        Baker.030-Baker.130 x106/uL    [id number] LPA COLLECTION D/T                      Untimed D      12-Mar-2023                         Untimed T      12:41                            SM/PLASMA PROTEO & BMS D/T                      Untimed D      12-Mar-2023                        Untimed T      12:41                            SM/WB DNA COLLECTION D/T                      Untimed D      12-Mar-2023                        Untimed T      12:41                            SM/WB PAXGENE RNA COLLECT D/T                      Untimed D      12-Mar-2023                        Untimed T      12:41                            SUBJECT FASTED AT LEAST 9 HRS?                      Pt fast 9h     No  Participant ID: [id number]  LP(a) Result: 155.4  Date Resulted: May 07, 2023  Date of Coaching: 09/22/2023   [x]  (Please check once complete) Discussed Lp(a) result with participant:    What is Lipoprotein(a) Lp(a)?  What is a "normal" Lp(a) level, and when should I be concerned?  How is Lp(a) related to "bad cholesterol?"  Does a high Lp(a) level increase my risk of heart disease?  How are Lp(a) levels inherited?  Do Lp(a) levels vary by race or ethnicity?  Diagnosing High Lp(a)  What are the signs of high Lp(a)?  How to get an Lp(a) test?  What Lp(a) results are considered high? How do I get a diagnosis of elevated lipoprotein(a)?  How can I lower my Lp(a)?    During the return of results coaching session, all the topics listed above were reviewed with the participant as per the documents: Guidance for Clinician-Patient Lp(a) Risk Assessment Discussion African American Heart Study and Micro-Learning Session: High Lipoprotein(a) 101.  Participant verbalized understanding of test results and what to do next: to ask their healthcare practitioner to test Lp(a) level, advise first-degree relatives to be screened, and work to reduce all cardiovascular risk factors within their control, especially LDL cholesterol.

## 2023-07-28 ENCOUNTER — Telehealth: Payer: Self-pay | Admitting: *Deleted

## 2023-07-28 NOTE — Telephone Encounter (Signed)
Called patient to give him lab results for research study he is enrolled in. Left message for patient to call back.  Billy Baker, Research Coordinator 07/28/2023  11:53 am

## 2023-08-12 ENCOUNTER — Telehealth: Payer: Self-pay | Admitting: *Deleted

## 2023-08-12 NOTE — Telephone Encounter (Signed)
 Patient enrolled in AA heart study called to give him his lab results. Second phone call will send letter.   Billy Baker, Research Coordinator 08/12/2023
